# Patient Record
Sex: Male | Born: 1985
Health system: Southern US, Community
[De-identification: ages and names within clinical notes are randomized; demographics above are authoritative.]

## PROBLEM LIST (undated history)

## (undated) DIAGNOSIS — G629 Polyneuropathy, unspecified: Secondary | ICD-10-CM

## (undated) DIAGNOSIS — F1020 Alcohol dependence, uncomplicated: Secondary | ICD-10-CM

## (undated) DIAGNOSIS — R42 Dizziness and giddiness: Secondary | ICD-10-CM

## (undated) DIAGNOSIS — F411 Generalized anxiety disorder: Secondary | ICD-10-CM

## (undated) DIAGNOSIS — E109 Type 1 diabetes mellitus without complications: Secondary | ICD-10-CM

## (undated) DIAGNOSIS — K219 Gastro-esophageal reflux disease without esophagitis: Secondary | ICD-10-CM

## (undated) DIAGNOSIS — J45909 Unspecified asthma, uncomplicated: Secondary | ICD-10-CM

## (undated) HISTORY — DX: Polyneuropathy, unspecified: G62.9

## (undated) HISTORY — DX: Dizziness and giddiness: R42

## (undated) HISTORY — PX: NO PAST SURGERIES: SHX2092

## (undated) HISTORY — DX: Generalized anxiety disorder: F41.1

## (undated) HISTORY — DX: Alcohol dependence, uncomplicated: F10.20

## (undated) HISTORY — PX: OTHER SURGICAL HISTORY: SHX169

---

## 2009-08-25 DIAGNOSIS — J309 Allergic rhinitis, unspecified: Secondary | ICD-10-CM | POA: Insufficient documentation

## 2010-05-13 ENCOUNTER — Encounter: Admission: RE | Admit: 2010-05-13 | Discharge: 2010-05-13 | Payer: Self-pay | Admitting: Otolaryngology

## 2014-01-27 DIAGNOSIS — E1065 Type 1 diabetes mellitus with hyperglycemia: Secondary | ICD-10-CM | POA: Insufficient documentation

## 2014-03-13 ENCOUNTER — Institutional Professional Consult (permissible substitution): Payer: Self-pay | Admitting: Cardiology

## 2014-03-13 ENCOUNTER — Encounter: Payer: Self-pay | Admitting: General Surgery

## 2014-03-13 DIAGNOSIS — R42 Dizziness and giddiness: Secondary | ICD-10-CM | POA: Insufficient documentation

## 2014-03-26 ENCOUNTER — Encounter: Payer: Self-pay | Admitting: Cardiology

## 2014-05-06 DIAGNOSIS — N529 Male erectile dysfunction, unspecified: Secondary | ICD-10-CM | POA: Insufficient documentation

## 2014-08-10 ENCOUNTER — Ambulatory Visit (INDEPENDENT_AMBULATORY_CARE_PROVIDER_SITE_OTHER): Payer: No Typology Code available for payment source

## 2014-08-10 VITALS — BP 144/94 | HR 84 | Resp 18

## 2014-08-10 DIAGNOSIS — E1141 Type 2 diabetes mellitus with diabetic mononeuropathy: Secondary | ICD-10-CM

## 2014-08-10 DIAGNOSIS — E1040 Type 1 diabetes mellitus with diabetic neuropathy, unspecified: Secondary | ICD-10-CM

## 2014-08-10 DIAGNOSIS — M79673 Pain in unspecified foot: Secondary | ICD-10-CM

## 2014-08-10 DIAGNOSIS — E1041 Type 1 diabetes mellitus with diabetic mononeuropathy: Secondary | ICD-10-CM

## 2014-08-10 DIAGNOSIS — B351 Tinea unguium: Secondary | ICD-10-CM

## 2014-08-10 MED ORDER — GABAPENTIN 300 MG PO CAPS: 300.0000 mg | ORAL_CAPSULE | Freq: Every day | ORAL | Status: DC

## 2014-08-10 MED ORDER — TAVABOROLE 5 % EX SOLN: CUTANEOUS | Status: DC

## 2014-08-10 NOTE — Patient Instructions (Signed)
Diabetes and Foot Care Diabetes may cause you to have problems because of poor blood supply (circulation) to your feet and legs. This may cause the skin on your feet to become thinner, break easier, and heal more slowly. Your skin may become dry, and the skin may peel and crack. You may also have nerve damage in your legs and feet causing decreased feeling in them. You may not notice minor injuries to your feet that could lead to infections or more serious problems. Taking care of your feet is one of the most important things you can do for yourself.  HOME CARE INSTRUCTIONS  Wear shoes at all times, even in the house. Do not go barefoot. Bare feet are easily injured.  Check your feet daily for blisters, cuts, and redness. If you cannot see the bottom of your feet, use a mirror or ask someone for help.  Wash your feet with warm water (do not use hot water) and mild soap. Then pat your feet and the areas between your toes until they are completely dry. Do not soak your feet as this can dry your skin.  Apply a moisturizing lotion or petroleum jelly (that does not contain alcohol and is unscented) to the skin on your feet and to dry, brittle toenails. Do not apply lotion between your toes.  Trim your toenails straight across. Do not dig under them or around the cuticle. File the edges of your nails with an emery board or nail file.  Do not cut corns or calluses or try to remove them with medicine.  Wear clean socks or stockings every day. Make sure they are not too tight. Do not wear knee-high stockings since they may decrease blood flow to your legs.  Wear shoes that fit properly and have enough cushioning. To break in new shoes, wear them for just a few hours a day. This prevents you from injuring your feet. Always look in your shoes before you put them on to be sure there are no objects inside.  Do not cross your legs. This may decrease the blood flow to your feet.  If you find a minor scrape,  cut, or break in the skin on your feet, keep it and the skin around it clean and dry. These areas may be cleansed with mild soap and water. Do not cleanse the area with peroxide, alcohol, or iodine.  When you remove an adhesive bandage, be sure not to damage the skin around it.  If you have a wound, look at it several times a day to make sure it is healing.  Do not use heating pads or hot water bottles. They may burn your skin. If you have lost feeling in your feet or legs, you may not know it is happening until it is too late.  Make sure your health care provider performs a complete foot exam at least annually or more often if you have foot problems. Report any cuts, sores, or bruises to your health care provider immediately. SEEK MEDICAL CARE IF:   You have an injury that is not healing.  You have cuts or breaks in the skin.  You have an ingrown nail.  You notice redness on your legs or feet.  You feel burning or tingling in your legs or feet.  You have pain or cramps in your legs and feet.  Your legs or feet are numb.  Your feet always feel cold. SEEK IMMEDIATE MEDICAL CARE IF:   There is increasing redness,   swelling, or pain in or around a wound.  There is a red line that goes up your leg.  Pus is coming from a wound.  You develop a fever or as directed by your health care provider.  You notice a bad smell coming from an ulcer or wound. Document Released: 06/09/2000 Document Revised: 02/12/2013 Document Reviewed: 11/19/2012 ExitCare Patient Information 2015 [employer]. This information is not intended to replace advice given to you by your health care provider. Make sure you discuss any questions you have with your health care provider.  

## 2014-08-10 NOTE — Progress Notes (Signed)
   Subjective:    Patient ID: Daneil DanCameron Guadiana, male    DOB: 08/17/1985, 29 y.o.   MRN: [id number]  HPI I AM A DIABETIC AND HAVE BEEN FOR ABOUT 25 YEARS AND DOES BURN AND THROB ON THE OUTSIDE AND THERE IS NO NUMBNESS OR TINGLING AND MY FEET ARE COLD AT TIMES AND NO SWELLING AND MY 2ND TOE ON MY RIGHT I JAMMED IT TWO MONTHS AGO AND IS THICK AND HAS A BLACK SPOT ON IT AND DR ARONSON REFERRED ME OVER    Review of Systems  All other systems reviewed and are negative.      Objective:   Physical Exam 29 year old white male well-developed well-nourished oriented 3 presents this time approximately 25 year history of diabetes was recently having burning and throbbing in the outside of the foot keeps him awake at night there is tingling also feet get cold at times there is also some swelling and some dystrophy discoloration and signs of injury to the second toes bilateral right more so than left likely consistent with onychomycosis. He jammed to 2 months ago is got some sign of dried blood in the right great right second toe left hallux shows yellowing and discoloration and friability I she left second digit not hallux. On orthopedic biomechanical exam rectus foot type ankle metatarsal subtalar joint motions normal mild flexible digital contractures noted average to high arch foot is identified. Muscle strength intact and symmetric bilateral there is normal plantar response and DTRs. On palpation there is good muscle strength tendons ligaments intact no areas of numbness there is normal plantar response DTRs not elicited dermatologically skin color pigment normal hair growth present nails somewhat criptotic and the second bilateral with discoloration friability second right history of injury or trauma is also noted. Orthopedic exam otherwise unremarkable no open wounds no ulcers no secondary infections no significant been deformities are identified.       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 Ring good shoes and some and tennis shoes and socks at most times however continues starting develop paresthesia consistent with diabetic neuritis and neuropathy. Plan at this time discussed options recommended gabapentin treadmill grams daily at bedtime as a trial understands side effects of sleepiness reappointed one to 2 months for follow-up and possible adjustments of medicines also did recommend a vitamin supplement will take a general vitamins multivitamin with B complex including vitamin B6B 12 and folic acid we'll obtain OTC vitamins at [employer] cost available. Also suggested this time prescription for Kerydin for the fungus nails Jodi GeraldsKerydin [facility] and patient will initiate daily application for 12 months as instructed. Reappointed 2 weeks for follow-up if not improved will consider higher doses of gabapentin or some point consider Lyrica or other alternative medications. Next  Alvan Dameichard Jesilyn Easom DPM

## 2014-09-10 ENCOUNTER — Ambulatory Visit: Payer: No Typology Code available for payment source

## 2014-11-06 ENCOUNTER — Telehealth: Payer: Self-pay | Admitting: *Deleted

## 2014-11-06 MED ORDER — GABAPENTIN 300 MG PO CAPS: 300.0000 mg | ORAL_CAPSULE | Freq: Every day | ORAL | Status: DC

## 2014-11-06 NOTE — Telephone Encounter (Signed)
Pt request ref[facility] of [facility] [place]  Dr. Ardelle AntonWagoner ordered refill as previously ordered.  I left a message informing pt the rx had been sent to the [facility].

## 2014-12-22 ENCOUNTER — Telehealth: Payer: Self-pay | Admitting: *Deleted

## 2014-12-22 MED ORDER — GABAPENTIN 300 MG PO CAPS: 300.0000 mg | ORAL_CAPSULE | Freq: Every day | ORAL | Status: DC

## 2014-12-22 NOTE — Telephone Encounter (Signed)
Pt states he was told by pharmacist it was too early for a refill of his Gabapentin.  Pt states Dr. Ralene CorkSikora said he could take two at bedtime if necessary.  Dr. Charlsie Merlesegal states refill Gabapentin 300mg  #60 1 or 2 tablets at hs plus 5 refills.  Left message that Gabapentin was refilled.

## 2015-04-13 ENCOUNTER — Telehealth: Payer: Self-pay | Admitting: *Deleted

## 2015-04-13 NOTE — Telephone Encounter (Signed)
Pt states most nights he takes 2 Gabapentin 300mg  a few hours before bedtime, but if continues to have pain may take another one.  Pt states he is currently out of the Gabapentin 300mg  a few days early and the pharmacy will not fill.  Dr. Charlsie Merlesegal states okay to refill early and he will review the dosing.  Called [facility] [phone] left message informing to fill early.

## 2015-05-12 ENCOUNTER — Telehealth: Payer: Self-pay | Admitting: *Deleted

## 2015-05-12 MED ORDER — GABAPENTIN 300 MG PO CAPS: ORAL_CAPSULE | ORAL | Status: DC

## 2015-05-12 NOTE — Telephone Encounter (Signed)
Pt states at last call his Gabapentin 300mg  was to be written so pt could take 1-3 at bedtime if needed for nerve pain, and was not changed.  I left message for pt and apologized that I had made a mistake and would call in Gabapentin 300mg  #120 1-3 capsules as needed at bedtime, and he would need to make an appt with Dr. Marylene LandStover in [place] prior to future refills.

## 2015-06-16 ENCOUNTER — Other Ambulatory Visit: Payer: Self-pay | Admitting: Podiatry

## 2015-07-28 ENCOUNTER — Telehealth: Payer: Self-pay | Admitting: *Deleted

## 2015-07-28 MED ORDER — GABAPENTIN 300 MG PO CAPS: ORAL_CAPSULE | ORAL | Status: DC

## 2015-07-28 NOTE — Telephone Encounter (Addendum)
Pt request refill of Gabapentin. I refilled the rx 05/2015 and had requested pt make an appt to become established with a new doctor.  Pt states he didn't understand that because the medication is working.  I told pt if this would be a long term medication for him there may need to be lab, or other testing to make certain he was staying in good health.  Pt agreed and I wrote for half of the usual refill and told him I would have a scheduler call him tomorrow.  Pt agreed.  08/17/2015-PT STATES HE HAS AN APPT FOR 08/24/2015 and would like to get refill of Gabapentin to get him to the appt date to get him established with a new doctor.  Left message that a partial refill was waiting for him at the [facility] in [place].

## 2015-08-18 MED ORDER — GABAPENTIN 300 MG PO CAPS: ORAL_CAPSULE | ORAL | Status: DC

## 2015-08-23 ENCOUNTER — Ambulatory Visit (INDEPENDENT_AMBULATORY_CARE_PROVIDER_SITE_OTHER): Payer: BLUE CROSS/BLUE SHIELD | Admitting: Podiatry

## 2015-08-23 ENCOUNTER — Encounter: Payer: Self-pay | Admitting: Podiatry

## 2015-08-23 VITALS — BP 121/79 | HR 83 | Resp 12

## 2015-08-23 DIAGNOSIS — M205X1 Other deformities of toe(s) (acquired), right foot: Secondary | ICD-10-CM | POA: Diagnosis not present

## 2015-08-23 DIAGNOSIS — E1040 Type 1 diabetes mellitus with diabetic neuropathy, unspecified: Secondary | ICD-10-CM | POA: Diagnosis not present

## 2015-08-23 MED ORDER — GABAPENTIN 100 MG PO CAPS: 100.0000 mg | ORAL_CAPSULE | Freq: Three times a day (TID) | ORAL | Status: DC

## 2015-08-23 MED ORDER — MELOXICAM 15 MG PO TABS: 15.0000 mg | ORAL_TABLET | Freq: Every day | ORAL | Status: DC

## 2015-08-23 NOTE — Addendum Note (Signed)
Addended by: Harlon Flor, Braxen Dobek L on: 08/23/2015 01:41 PM   Modules accepted: Orders

## 2015-08-23 NOTE — Addendum Note (Signed)
Addended by: Harlon Flor, Ygnacio Fecteau L on: 08/23/2015 03:43 PM   Modules accepted: Orders, Medications

## 2015-08-23 NOTE — Progress Notes (Signed)
Subjective:     Patient ID: Calvin Baxter, male   DOB: 1985-08-11, 30 y.o.   MRN: [id number]  HPI this patient presents to the office with chief complaint of tingling on the outside of both of his feet. He was initially a patient of Dr. Ralene Cork who diagnosed him as having a diabetic neuropathy Dr. Ralene Cork Road for a prescription of gabapentin [place]. He states that he has been taking the gabapentin which has helped to limit the burning in his feet. He was told to come to the office by his pharmacist to get a new prescription for the gabapentin. He states as well as points to the inside and outside of both feet as the areas of his tingling. Patient has been a diabetic since the age of [age].  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  He describes no other symptoms at this time.   Review of Systems     Objective:   Physical Exam GENERAL APPEARANCE: Alert, conversant. Appropriately groomed. No acute distress.  VASCULAR: Pedal pulses palpable at  [facility] and PT bilateral.  Capillary refill time is immediate to all digits,  Normal temperature gradient.  Digital hair growth is present bilateral  NEUROLOGIC: sensation is normal to 5.07 monofilament at 5/5 sites bilateral.  Light touch is intact bilateral, Muscle strength normal.  MUSCULOSKELETAL: acceptable muscle strength, tone and stability bilateral.  Intrinsic muscluature intact bilateral.  Rectus appearance of foot and digits noted bilateral. Dorsal spurring 1st MPJ right foot with hammer toe right foot.  Patient has tingling along the medial aspect 1st MPJ B/L and along the dorsolateral aspect 5th MPJ B/L  No palpable pain or swelling noted.  DERMATOLOGIC: skin color, texture, and turgor are within normal limits.  No preulcerative lesions or ulcers  are seen, no interdigital maceration noted.  No open lesions present.  Digital nails are asymptomatic. No drainage noted.      Assessment:     Diabetic neuropathy      Plan:     ROV  After my evaluation of his feet I was not sure he was experiencing neuropathy symptoms.  I felt his problem could stem from  1st MPJ limitation of motion and tailor bunion B/L. Therefore I prescribed one month of gabapentin as well as one month of Mobic.  Told him to determine which shoes aggravate his condition.  RTC 4-6 weeks for further re-evaluation.     Helane Gunther DPM

## 2015-09-07 ENCOUNTER — Telehealth: Payer: Self-pay | Admitting: *Deleted

## 2015-09-07 NOTE — Telephone Encounter (Signed)
Pt states he was prescribed 15 Meloxicam and would like a refill.  I told pt his records show he was prescribed #30 and at one tablet a day he should have enough to get him to about 09/20/2015, which was enough time to get an appt to discuss medications and refills as ordered by Dr. Stacie AcresMayer on 08/23/2015.

## 2015-09-08 ENCOUNTER — Telehealth: Payer: Self-pay | Admitting: *Deleted

## 2015-09-08 MED ORDER — MELOXICAM 15 MG PO TABS: 15.0000 mg | ORAL_TABLET | Freq: Every day | ORAL | Status: DC

## 2015-09-08 NOTE — Telephone Encounter (Signed)
Pt states he only received #15 Meloxicam 15mg  and was suppose to get #30.  I told pt I refilled for the remaining #15 and that he needed to make sure he kept his appt to make certain he was healing properly.

## 2015-09-16 ENCOUNTER — Telehealth: Payer: Self-pay | Admitting: *Deleted

## 2015-09-16 MED ORDER — GABAPENTIN 100 MG PO CAPS: 100.0000 mg | ORAL_CAPSULE | Freq: Three times a day (TID) | ORAL | Status: DC

## 2015-09-16 NOTE — Telephone Encounter (Addendum)
Pt states prior to switching to Dr. Stacie AcresMayer, Dr. Ralene CorkSikora had pt taking Gabapentin 300mg  one tablet at 700pm and 2 tablets at bedtime and he was able to rest.  Pt state Dr. Stacie AcresMayer put him on Meloxicam and decreased to Gabapentin 100mg  1 tablet 3 times a day and the 2 tablets at bedtime and he is having difficulty sleeping.  I told pt I would ask Dr. Stacie AcresMayer 09/17/2015 and call back.  Pt asked for refill of the Gabapentin even as Dr. Stacie AcresMayer wrote, because he is totally out.  I agreed.  09/20/2015-Left message informing pt of Dr. Aram BeechamMayer's orders to return to the Gabapentin 300mg .  Orders to Pharmacy.

## 2015-09-20 ENCOUNTER — Ambulatory Visit: Payer: BLUE CROSS/BLUE SHIELD | Admitting: Podiatry

## 2015-09-20 MED ORDER — GABAPENTIN 300 MG PO CAPS: ORAL_CAPSULE | ORAL | Status: DC

## 2015-09-27 ENCOUNTER — Ambulatory Visit: Payer: BLUE CROSS/BLUE SHIELD | Admitting: Podiatry

## 2015-09-29 DIAGNOSIS — E108 Type 1 diabetes mellitus with unspecified complications: Secondary | ICD-10-CM | POA: Diagnosis not present

## 2015-09-29 DIAGNOSIS — F411 Generalized anxiety disorder: Secondary | ICD-10-CM | POA: Diagnosis not present

## 2015-09-29 DIAGNOSIS — E1065 Type 1 diabetes mellitus with hyperglycemia: Secondary | ICD-10-CM | POA: Diagnosis not present

## 2015-11-17 DIAGNOSIS — Z3141 Encounter for fertility testing: Secondary | ICD-10-CM | POA: Diagnosis not present

## 2015-12-21 DIAGNOSIS — E108 Type 1 diabetes mellitus with unspecified complications: Secondary | ICD-10-CM | POA: Diagnosis not present

## 2015-12-21 DIAGNOSIS — Z9641 Presence of insulin pump (external) (internal): Secondary | ICD-10-CM | POA: Diagnosis not present

## 2015-12-21 DIAGNOSIS — Z794 Long term (current) use of insulin: Secondary | ICD-10-CM | POA: Diagnosis not present

## 2015-12-21 DIAGNOSIS — E109 Type 1 diabetes mellitus without complications: Secondary | ICD-10-CM | POA: Diagnosis not present

## 2016-01-06 DIAGNOSIS — E1065 Type 1 diabetes mellitus with hyperglycemia: Secondary | ICD-10-CM | POA: Diagnosis not present

## 2016-01-06 DIAGNOSIS — R0981 Nasal congestion: Secondary | ICD-10-CM | POA: Diagnosis not present

## 2016-01-06 DIAGNOSIS — M67441 Ganglion, right hand: Secondary | ICD-10-CM | POA: Diagnosis not present

## 2016-01-06 DIAGNOSIS — J302 Other seasonal allergic rhinitis: Secondary | ICD-10-CM | POA: Diagnosis not present

## 2016-02-17 DIAGNOSIS — Z9641 Presence of insulin pump (external) (internal): Secondary | ICD-10-CM | POA: Diagnosis not present

## 2016-02-17 DIAGNOSIS — Z794 Long term (current) use of insulin: Secondary | ICD-10-CM | POA: Diagnosis not present

## 2016-02-17 DIAGNOSIS — E109 Type 1 diabetes mellitus without complications: Secondary | ICD-10-CM | POA: Diagnosis not present

## 2016-02-17 DIAGNOSIS — E108 Type 1 diabetes mellitus with unspecified complications: Secondary | ICD-10-CM | POA: Diagnosis not present

## 2016-02-23 DIAGNOSIS — E1065 Type 1 diabetes mellitus with hyperglycemia: Secondary | ICD-10-CM | POA: Diagnosis not present

## 2016-02-23 DIAGNOSIS — E108 Type 1 diabetes mellitus with unspecified complications: Secondary | ICD-10-CM | POA: Diagnosis not present

## 2016-02-23 DIAGNOSIS — F5221 Male erectile disorder: Secondary | ICD-10-CM | POA: Diagnosis not present

## 2016-02-23 DIAGNOSIS — F411 Generalized anxiety disorder: Secondary | ICD-10-CM | POA: Diagnosis not present

## 2016-04-19 ENCOUNTER — Telehealth: Payer: Self-pay | Admitting: Podiatry

## 2016-04-19 ENCOUNTER — Telehealth: Payer: Self-pay | Admitting: *Deleted

## 2016-04-19 MED ORDER — GABAPENTIN 300 MG PO CAPS: ORAL_CAPSULE | ORAL | 0 refills | Status: DC

## 2016-04-19 NOTE — Telephone Encounter (Signed)
Dr. Stacie AcresMayer states he received a call from pt requesting Gabapentin, and he does not prescribe this, recommends pt see one of the other doctors in our practice. Informed pt of Dr. Aram BeechamMayer's statement and recommendation to be seen by another doctor. I told pt I would refill for 30 days and have a scheduler call him tomorrow to help him schedule with Dr. Marylene LandStover in [place]

## 2016-04-19 NOTE — Telephone Encounter (Signed)
Patient request a refill on Gabapentin 300 MG. [facility] [address] in [place].

## 2016-04-26 ENCOUNTER — Encounter: Payer: Self-pay | Admitting: Sports Medicine

## 2016-04-26 ENCOUNTER — Ambulatory Visit (INDEPENDENT_AMBULATORY_CARE_PROVIDER_SITE_OTHER): Payer: BLUE CROSS/BLUE SHIELD | Admitting: Sports Medicine

## 2016-04-26 DIAGNOSIS — M79671 Pain in right foot: Secondary | ICD-10-CM | POA: Diagnosis not present

## 2016-04-26 DIAGNOSIS — M79672 Pain in left foot: Secondary | ICD-10-CM | POA: Diagnosis not present

## 2016-04-26 DIAGNOSIS — E1040 Type 1 diabetes mellitus with diabetic neuropathy, unspecified: Secondary | ICD-10-CM | POA: Diagnosis not present

## 2016-04-26 MED ORDER — GABAPENTIN 300 MG PO CAPS: ORAL_CAPSULE | ORAL | 5 refills | Status: DC

## 2016-04-26 NOTE — Progress Notes (Signed)
Subjective: Calvin Baxter is a 30 y.o. male patient with history of diabetes who presents to office today complaining of tingling to both feet best controlled with Gabapentin; desires med refill. Was started on medication by Dr. Ralene CorkSikora with improvement. 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 Denies any other complaints.  A1c 7.9  Patient Active Problem List   Diagnosis Date Noted  . Dizziness 03/13/2014   Current Outpatient Prescriptions on File Prior to Visit  Medication Sig Dispense Refill  . escitalopram (LEXAPRO) 10 MG tablet Take 10 mg by mouth daily.    . fluticasone (FLONASE) 50 MCG/ACT nasal spray Place 2 sprays into both nostrils daily.    Marland Kitchen. glucagon (GLUCAGON EMERGENCY) 1 MG injection Inject 1 mg into the vein once as needed (in emergency situation).    . Insulin Disposable Pump MISC by Does not apply route.    . insulin lispro (HUMALOG) 100 UNIT/ML injection Inject 100 Units into the skin as needed for high blood sugar (3 vials monthly per pump A1C not at goal <7%).    . LORazepam (ATIVAN) 1 MG tablet Take 1 mg by mouth every 8 (eight) hours.    . meloxicam (MOBIC) 15 MG tablet Take 1 tablet (15 mg total) by mouth daily. 15 tablet 0  . Tavaborole (KERYDIN) 5 % SOLN Apply to affected nails once daily for 12 months 10 mL 2   No current facility-administered medications on file prior to visit.    No Known Allergies  No results found for this or any previous visit (from the past 2160 hour(s)).  Objective: General: Patient is awake, alert, and oriented x 3 and in no acute distress.  Integument: Skin is warm, dry and supple bilateral. Nails are short and thick but well manicured. No signs of infection. No open lesions or preulcerative lesions present bilateral. Remaining integument unremarkable.  Vasculature:  Dorsalis Pedis pulse 2/4 bilateral. Posterior Tibial pulse  2/4 bilateral.  Capillary fill time <3 sec 1-5 bilateral. Positive hair growth to the level of the digits.Temperature gradient within normal limits. No varicosities present bilateral. No edema present bilateral.   Neurology: The patient has intact sensation measured with a 5.07/10g Semmes Weinstein Monofilament at all pedal sites bilateral with some areas of hypersensitivity. Vibratory sensation intact bilateral with tuning fork. No Babinski sign present bilateral.   Musculoskeletal: Asymptomatic prominent styloid process and hallux limitus pedal deformities noted bilateral. Muscular strength 5/5 in all lower extremity muscular groups bilateral without pain on range of motion. No tenderness with calf compression bilateral.  Assessment and Plan: Problem List Items Addressed This Visit    None    Visit Diagnoses    Type 1 diabetes, controlled, with neuropathy (HCC)    -  Primary   Relevant Medications   Insulin Lispro (HUMALOG [place])   gabapentin (NEURONTIN) 300 MG capsule   Foot pain, bilateral          -Examined patient. -Discussed and educated patient on diabetic foot care, especially with regards to the vascular, neurological and musculoskeletal systems.  -Stressed the importance of good glycemic control and the detriment of not controlling glucose levels in relation to the foot. -Refilled Gabapentin -Recommend continue with good supportive shoes and to consider inserts  -Answered all patient questions -Patient to return  in 3-4 months for follow up diabetic foot exam and med check -Patient advised to call the office if  any problems or questions arise in the meantime.  Landis Martins, DPM

## 2016-05-10 DIAGNOSIS — Z794 Long term (current) use of insulin: Secondary | ICD-10-CM | POA: Diagnosis not present

## 2016-05-10 DIAGNOSIS — E108 Type 1 diabetes mellitus with unspecified complications: Secondary | ICD-10-CM | POA: Diagnosis not present

## 2016-05-10 DIAGNOSIS — Z9641 Presence of insulin pump (external) (internal): Secondary | ICD-10-CM | POA: Diagnosis not present

## 2016-05-10 DIAGNOSIS — E109 Type 1 diabetes mellitus without complications: Secondary | ICD-10-CM | POA: Diagnosis not present

## 2016-06-14 DIAGNOSIS — Z6823 Body mass index (BMI) 23.0-23.9, adult: Secondary | ICD-10-CM | POA: Diagnosis not present

## 2016-06-14 DIAGNOSIS — J3089 Other allergic rhinitis: Secondary | ICD-10-CM | POA: Diagnosis not present

## 2016-07-26 DIAGNOSIS — J029 Acute pharyngitis, unspecified: Secondary | ICD-10-CM | POA: Diagnosis not present

## 2016-07-26 DIAGNOSIS — Z1389 Encounter for screening for other disorder: Secondary | ICD-10-CM | POA: Diagnosis not present

## 2016-07-26 DIAGNOSIS — F411 Generalized anxiety disorder: Secondary | ICD-10-CM | POA: Diagnosis not present

## 2016-07-26 DIAGNOSIS — F5221 Male erectile disorder: Secondary | ICD-10-CM | POA: Diagnosis not present

## 2016-07-26 DIAGNOSIS — E1065 Type 1 diabetes mellitus with hyperglycemia: Secondary | ICD-10-CM | POA: Diagnosis not present

## 2016-07-26 DIAGNOSIS — E108 Type 1 diabetes mellitus with unspecified complications: Secondary | ICD-10-CM | POA: Diagnosis not present

## 2016-08-23 ENCOUNTER — Ambulatory Visit: Payer: BLUE CROSS/BLUE SHIELD | Admitting: Sports Medicine

## 2016-09-29 DIAGNOSIS — Z3189 Encounter for other procreative management: Secondary | ICD-10-CM | POA: Diagnosis not present

## 2016-10-02 DIAGNOSIS — Z6824 Body mass index (BMI) 24.0-24.9, adult: Secondary | ICD-10-CM | POA: Diagnosis not present

## 2016-10-02 DIAGNOSIS — M79671 Pain in right foot: Secondary | ICD-10-CM | POA: Diagnosis not present

## 2016-11-14 DIAGNOSIS — Z0000 Encounter for general adult medical examination without abnormal findings: Secondary | ICD-10-CM | POA: Insufficient documentation

## 2016-11-14 DIAGNOSIS — E1065 Type 1 diabetes mellitus with hyperglycemia: Secondary | ICD-10-CM | POA: Diagnosis not present

## 2016-11-22 ENCOUNTER — Other Ambulatory Visit: Payer: Self-pay | Admitting: Sports Medicine

## 2016-11-22 ENCOUNTER — Telehealth: Payer: Self-pay | Admitting: Sports Medicine

## 2016-11-22 DIAGNOSIS — E1040 Type 1 diabetes mellitus with diabetic neuropathy, unspecified: Secondary | ICD-10-CM

## 2016-11-22 MED ORDER — GABAPENTIN 300 MG PO CAPS: ORAL_CAPSULE | ORAL | 5 refills | Status: DC

## 2016-11-22 NOTE — Telephone Encounter (Signed)
Pt needs a refill on Gabepentin.Marland Kitchen.Marland Kitchen.Pharmacy [facility] [place]

## 2016-11-22 NOTE — Progress Notes (Signed)
Refilled Gabapentin -Dr. Kyon Bentler 

## 2016-11-23 DIAGNOSIS — Z0000 Encounter for general adult medical examination without abnormal findings: Secondary | ICD-10-CM | POA: Diagnosis not present

## 2016-11-23 DIAGNOSIS — F411 Generalized anxiety disorder: Secondary | ICD-10-CM | POA: Diagnosis not present

## 2016-11-23 DIAGNOSIS — E1065 Type 1 diabetes mellitus with hyperglycemia: Secondary | ICD-10-CM | POA: Diagnosis not present

## 2016-11-23 DIAGNOSIS — J309 Allergic rhinitis, unspecified: Secondary | ICD-10-CM | POA: Diagnosis not present

## 2016-11-23 DIAGNOSIS — F5221 Male erectile disorder: Secondary | ICD-10-CM | POA: Diagnosis not present

## 2016-12-10 DIAGNOSIS — J0100 Acute maxillary sinusitis, unspecified: Secondary | ICD-10-CM | POA: Diagnosis not present

## 2016-12-15 DIAGNOSIS — E108 Type 1 diabetes mellitus with unspecified complications: Secondary | ICD-10-CM | POA: Diagnosis not present

## 2016-12-15 DIAGNOSIS — Z794 Long term (current) use of insulin: Secondary | ICD-10-CM | POA: Diagnosis not present

## 2016-12-15 DIAGNOSIS — E1065 Type 1 diabetes mellitus with hyperglycemia: Secondary | ICD-10-CM | POA: Diagnosis not present

## 2016-12-15 DIAGNOSIS — K521 Toxic gastroenteritis and colitis: Secondary | ICD-10-CM | POA: Diagnosis not present

## 2016-12-15 DIAGNOSIS — Z9641 Presence of insulin pump (external) (internal): Secondary | ICD-10-CM | POA: Diagnosis not present

## 2016-12-15 DIAGNOSIS — Z6824 Body mass index (BMI) 24.0-24.9, adult: Secondary | ICD-10-CM | POA: Diagnosis not present

## 2016-12-15 DIAGNOSIS — E109 Type 1 diabetes mellitus without complications: Secondary | ICD-10-CM | POA: Diagnosis not present

## 2017-01-02 DIAGNOSIS — F419 Anxiety disorder, unspecified: Secondary | ICD-10-CM | POA: Diagnosis not present

## 2017-01-02 DIAGNOSIS — R1110 Vomiting, unspecified: Secondary | ICD-10-CM | POA: Diagnosis not present

## 2017-01-02 DIAGNOSIS — K529 Noninfective gastroenteritis and colitis, unspecified: Secondary | ICD-10-CM | POA: Diagnosis not present

## 2017-01-02 DIAGNOSIS — Z794 Long term (current) use of insulin: Secondary | ICD-10-CM | POA: Diagnosis not present

## 2017-01-02 DIAGNOSIS — R112 Nausea with vomiting, unspecified: Secondary | ICD-10-CM | POA: Diagnosis not present

## 2017-01-02 DIAGNOSIS — E119 Type 2 diabetes mellitus without complications: Secondary | ICD-10-CM | POA: Diagnosis not present

## 2017-02-07 DIAGNOSIS — F410 Panic disorder [episodic paroxysmal anxiety] without agoraphobia: Secondary | ICD-10-CM | POA: Diagnosis not present

## 2017-02-07 DIAGNOSIS — F411 Generalized anxiety disorder: Secondary | ICD-10-CM | POA: Diagnosis not present

## 2017-02-14 DIAGNOSIS — J069 Acute upper respiratory infection, unspecified: Secondary | ICD-10-CM | POA: Diagnosis not present

## 2017-02-28 DIAGNOSIS — E109 Type 1 diabetes mellitus without complications: Secondary | ICD-10-CM | POA: Diagnosis not present

## 2017-02-28 DIAGNOSIS — Z794 Long term (current) use of insulin: Secondary | ICD-10-CM | POA: Diagnosis not present

## 2017-02-28 DIAGNOSIS — E108 Type 1 diabetes mellitus with unspecified complications: Secondary | ICD-10-CM | POA: Diagnosis not present

## 2017-02-28 DIAGNOSIS — Z9641 Presence of insulin pump (external) (internal): Secondary | ICD-10-CM | POA: Diagnosis not present

## 2017-03-01 DIAGNOSIS — F410 Panic disorder [episodic paroxysmal anxiety] without agoraphobia: Secondary | ICD-10-CM | POA: Diagnosis not present

## 2017-03-01 DIAGNOSIS — F411 Generalized anxiety disorder: Secondary | ICD-10-CM | POA: Diagnosis not present

## 2017-04-24 DIAGNOSIS — F410 Panic disorder [episodic paroxysmal anxiety] without agoraphobia: Secondary | ICD-10-CM | POA: Diagnosis not present

## 2017-04-24 DIAGNOSIS — F411 Generalized anxiety disorder: Secondary | ICD-10-CM | POA: Diagnosis not present

## 2017-05-03 ENCOUNTER — Encounter: Payer: Self-pay | Admitting: Gastroenterology

## 2017-05-05 DIAGNOSIS — E1165 Type 2 diabetes mellitus with hyperglycemia: Secondary | ICD-10-CM | POA: Diagnosis not present

## 2017-05-05 DIAGNOSIS — F419 Anxiety disorder, unspecified: Secondary | ICD-10-CM | POA: Diagnosis not present

## 2017-05-05 DIAGNOSIS — E119 Type 2 diabetes mellitus without complications: Secondary | ICD-10-CM | POA: Diagnosis not present

## 2017-05-28 ENCOUNTER — Ambulatory Visit: Payer: BLUE CROSS/BLUE SHIELD | Admitting: Podiatry

## 2017-05-28 ENCOUNTER — Encounter: Payer: Self-pay | Admitting: Podiatry

## 2017-05-28 VITALS — BP 123/83 | HR 74

## 2017-05-28 DIAGNOSIS — E1142 Type 2 diabetes mellitus with diabetic polyneuropathy: Secondary | ICD-10-CM | POA: Diagnosis not present

## 2017-05-28 DIAGNOSIS — M792 Neuralgia and neuritis, unspecified: Secondary | ICD-10-CM | POA: Diagnosis not present

## 2017-05-28 MED ORDER — NONFORMULARY OR COMPOUNDED ITEM: 1.0000 g | Freq: Four times a day (QID) | 2 refills | Status: DC

## 2017-05-28 NOTE — Progress Notes (Signed)
  Subjective:  Patient ID: Calvin Baxter, male    DOB: 08/11/1985,  MRN: [id number]  Chief Complaint  Patient presents with  . Foot Pain   31 y.o. male returns for the above complaint.  States that he was taking gabapentin long-term for neuropathic pain.  States that he is seeing a new psychiatrist she is giving him gabapentin for anxiety but has lowered the dose and now he is having increasing pain in his foot.  Objective:   Vitals:   05/28/17 1005  BP: 123/83  Pulse: 74   General AA&O x3. Normal mood and affect.  Vascular Pedal pulses palpable.  Neurologic Epicritic sensation grossly intact.  Dermatologic No open lesions. Skin normal texture and turgor.  Orthopedic: No pain to palpation either foot.   Assessment & Plan:  Patient was evaluated and treated and all questions answered.  Neuropathic pain -Discussed that multiple providers should not be adjusting oral dose of gabapentin.  Defer to his psychiatrist for management. -Rx compound pain cream with gabapentin.  Advised that this would help with his pain and also with low systemic absorption should not significantly alter the dose that he is receiving systemically  Follow-up as needed

## 2017-06-01 DIAGNOSIS — E108 Type 1 diabetes mellitus with unspecified complications: Secondary | ICD-10-CM | POA: Diagnosis not present

## 2017-06-01 DIAGNOSIS — Z794 Long term (current) use of insulin: Secondary | ICD-10-CM | POA: Diagnosis not present

## 2017-06-01 DIAGNOSIS — Z9641 Presence of insulin pump (external) (internal): Secondary | ICD-10-CM | POA: Diagnosis not present

## 2017-06-01 DIAGNOSIS — E109 Type 1 diabetes mellitus without complications: Secondary | ICD-10-CM | POA: Diagnosis not present

## 2017-06-08 DIAGNOSIS — F410 Panic disorder [episodic paroxysmal anxiety] without agoraphobia: Secondary | ICD-10-CM | POA: Diagnosis not present

## 2017-06-08 DIAGNOSIS — F411 Generalized anxiety disorder: Secondary | ICD-10-CM | POA: Diagnosis not present

## 2017-06-20 NOTE — Progress Notes (Signed)
Referral paperwork loaded - updated med list

## 2017-06-28 ENCOUNTER — Encounter (INDEPENDENT_AMBULATORY_CARE_PROVIDER_SITE_OTHER): Payer: Self-pay

## 2017-06-28 ENCOUNTER — Ambulatory Visit: Payer: BLUE CROSS/BLUE SHIELD | Admitting: Gastroenterology

## 2017-06-28 ENCOUNTER — Encounter: Payer: Self-pay | Admitting: Gastroenterology

## 2017-06-28 ENCOUNTER — Other Ambulatory Visit (INDEPENDENT_AMBULATORY_CARE_PROVIDER_SITE_OTHER): Payer: BLUE CROSS/BLUE SHIELD

## 2017-06-28 VITALS — BP 114/80 | HR 80 | Ht 67.0 in | Wt 172.5 lb

## 2017-06-28 DIAGNOSIS — E108 Type 1 diabetes mellitus with unspecified complications: Secondary | ICD-10-CM | POA: Diagnosis not present

## 2017-06-28 DIAGNOSIS — R110 Nausea: Secondary | ICD-10-CM | POA: Diagnosis not present

## 2017-06-28 DIAGNOSIS — R194 Change in bowel habit: Secondary | ICD-10-CM

## 2017-06-28 DIAGNOSIS — K219 Gastro-esophageal reflux disease without esophagitis: Secondary | ICD-10-CM | POA: Diagnosis not present

## 2017-06-28 DIAGNOSIS — F1021 Alcohol dependence, in remission: Secondary | ICD-10-CM | POA: Diagnosis not present

## 2017-06-28 DIAGNOSIS — F411 Generalized anxiety disorder: Secondary | ICD-10-CM | POA: Diagnosis not present

## 2017-06-28 DIAGNOSIS — F1020 Alcohol dependence, uncomplicated: Secondary | ICD-10-CM | POA: Diagnosis not present

## 2017-06-28 DIAGNOSIS — K110 Atrophy of salivary gland: Secondary | ICD-10-CM | POA: Diagnosis not present

## 2017-06-28 DIAGNOSIS — IMO0002 Reserved for concepts with insufficient information to code with codable children: Secondary | ICD-10-CM | POA: Insufficient documentation

## 2017-06-28 DIAGNOSIS — E1065 Type 1 diabetes mellitus with hyperglycemia: Secondary | ICD-10-CM | POA: Diagnosis not present

## 2017-06-28 LAB — IGA: IGA: 370 mg/dL (ref 68–378)

## 2017-06-28 MED ORDER — RANITIDINE HCL 150 MG PO TABS: 150.0000 mg | ORAL_TABLET | Freq: Every day | ORAL | 3 refills | Status: DC

## 2017-06-28 MED ORDER — ONDANSETRON 8 MG PO TBDP: 8.0000 mg | ORAL_TABLET | Freq: Three times a day (TID) | ORAL | 3 refills | Status: DC | PRN

## 2017-06-28 NOTE — Patient Instructions (Addendum)
If you are age 33 or older, your body mass index should be between 23-30. Your Body mass index is 27.02 kg/m. If this is out of the aforementioned range listed, please consider follow up with your Primary Care Provider.  If you are age 33 or younger, your body mass index should be between 19-25. Your Body mass index is 27.02 kg/m. If this is out of the aformentioned range listed, please consider follow up with your Primary Care Provider.   Please go to the lab in the basement of our building to have lab work done as you leave today.  We have sent the following medications to your pharmacy for you to pick up at your convenience: Zantac, 150mg  Zofran, 8mg  ODT  We have given you a low FOD-Map diet to follow.  Please purchase the following medications over the counter and take as directed: Imodium, take as needed  We are requesting your labs from Dr. Lanell MatarAronson's office for the past year.  Thank you for entrusting me with your care and for [facility], Dr. Ileene PatrickSteven Armbruster

## 2017-06-28 NOTE — Progress Notes (Signed)
HPI :  32 y/o male with a history of type I DM, neuropathy, anxiety, referred here by Dr. Geoffry Paradise for a new patient visit for intermittent nausea / diarrhea  He reports episodic nausea which is bothered him for a long time. He states this is usually associated when he feels anxious, and thinks it is very much related to his anxiety. He has been hospitalized twice in the past year for panic attacks, and states sometimes nausea can trigger these. He denies any vomiting with these episodes. He denies any postprandial nausea, the nausea seems to occur sporadically without clear triggers. He denies any abdominal pain. He does endorse some acid reflux symptoms of heartburn for which she takes over-the-counter Tums which does help him. He states when he uses Viagra makes his reflux significantly worse. He denies any dysphagia. He denies any weight loss.   He does endorse some early satiety, which is mild, ongoing for the past 2 years. He denies any epigastric pain. He does endorse some increased gas and bloating. He has sporadic loose stools. Some days has regular bowel movements. Other days has looser stools, again ulcer usually related to when he is anxious. He has taken Pepto-Bismol or Imodium which works for a well for him. He denies any blood in his stools. He's had a very rare episode of nocturnal symptoms. That is not common.   He has a history of type 1 diabetes since age 31, his hemoglobin A1c has been in the 9 range. He endorses ongoing issues with severe anxiety and panic attacks. He endorses a history of alcoholism, previously drank anywhere from 2-5 beers per night to help him sleep. He stopped drinking about a month ago and states this has not improved his nausea or bowels at all.   He denies any family history of colon cancer, no family history of celiac disease or Crohn's.   Past Medical History:  Diagnosis Date  . Alcoholism (HCC)   . Anxiety, generalized    on lexapro  .  Diabetes mellitus without complication (HCC)    type 1  . Dizziness   . Neuropathy    diagnosed by Dr Ralene Cork per patient     Past Surgical History:  Procedure Laterality Date  . None     Family History  Problem Relation Age of Onset  . Breast cancer Maternal Grandmother   . Colon cancer Neg Hx   . Liver cancer Neg Hx   . Rectal cancer Neg Hx   . Stomach cancer Neg Hx   . Esophageal cancer Neg Hx    Social History   Tobacco Use  . Smoking status: Former Games developer  . Smokeless tobacco: Never Used  . Tobacco comment: quit in 2008  Substance Use Topics  . Alcohol use: No    Frequency: Never    Comment: history of alcoholism  . Drug use: No   Current Outpatient Medications  Medication Sig Dispense Refill  . ALPRAZolam (XANAX) 0.25 MG tablet   0  . busPIRone (BUSPAR) 30 MG tablet 15 mg 2 (two) times daily.   0  . clonazePAM (KLONOPIN) 0.5 MG tablet 2 (two) times daily.   0  . fluvoxaMINE (LUVOX) 25 MG tablet at bedtime.   0  . gabapentin (NEURONTIN) 400 MG capsule as directed. 400mg  q am. 400mg  q noon and 800mg  qhs  0  . glucagon (GLUCAGON EMERGENCY) 1 MG injection Inject 1 mg into the vein once as needed (in emergency situation).    Marland Kitchen  insulin aspart protamine- aspart (NOVOLOG MIX 70/30) (70-30) 100 UNIT/ML injection Inject into the skin. Use as directed in insulin pump, max 100 units/day    . Insulin Disposable Pump MISC by Does not apply route.    . insulin lispro (HUMALOG) 100 UNIT/ML injection Inject 100 Units into the skin as needed for high blood sugar (3 vials monthly per pump A1C not at goal <7%).    . LORazepam (ATIVAN) 1 MG tablet Take 1 mg by mouth every 8 (eight) hours.    . metoprolol succinate (TOPROL-XL) 25 MG 24 hr tablet Take 25 mg by mouth daily.    . NONFORMULARY OR COMPOUNDED ITEM Apply 1-2 g topically 4 (four) times daily. Peripheral Neuropathy cream: Bupivacaine 1% Doxepin 3% Gabapentin 6% Ibuprofen 3% Pentoxifylline 3% 120 each 2  . PRESCRIPTION MEDICATION  C - peripheral neuropathy cream - qhs    . sildenafil (REVATIO) 20 MG tablet take 5 tablets by mouth 1 hour prior to sexual activity as needed  3  . valACYclovir (VALTREX) 1000 MG tablet Take 1,000 mg by mouth 3 (three) times daily.     No current facility-administered medications for this visit.    No Known Allergies   Review of Systems: All systems reviewed and negative except where noted in HPI.   No results found for: WBC, HGB, HCT, MCV, PLT  No results found for: CREATININE, BUN, NA, K, CL, CO2  No labs on file.   Physical Exam: BP 114/80   Pulse 80   Ht 5\' 7"  (1.702 m)   Wt 172 lb 8 oz (78.2 kg)   BMI 27.02 kg/m  Constitutional: Pleasant,well-developed, male in no acute distress. HEENT: Normocephalic and atraumatic. Conjunctivae are normal. No scleral icterus. Neck supple.  Cardiovascular: Normal rate, regular rhythm.  Pulmonary/chest: Effort normal and breath sounds normal. No wheezing, rales or rhonchi. Abdominal: Soft, nondistended, nontender.. There are no masses palpable. No hepatomegaly. Extremities: no edema Lymphadenopathy: No cervical adenopathy noted. Neurological: Alert and oriented to person place and time. Skin: Skin is warm and dry. No rashes noted. Psychiatric: Normal mood and affect. Behavior is normal.   ASSESSMENT AND PLAN: 32 year old male with a history of alcoholism, anxiety, type 1 diabetes, presenting with intermittent nausea, intermittent loose stools, and reflux symptoms.  Intermittent nausea - this very well may be related to anxiety and panic attacks, he feels there is a strong correlation. While he has some very mild early satiety at times, in light of his diabetes gastroparesis is possible, although this is quite mild and he does not vomit, thus may be less likely. He is working with his psychiatrist for management of anxiety. I offered him some Zofran to use as needed, he thinks this will be useful to help prevent panic attacks. I asked for  basic labs from his primary care haven't labs on file from today. We discussed potential endoscopy, he wanted to avoid that for now, he can call me for reassessment of his symptoms persist.  GERD - discussed options, we'll try Zantac 150 mg once to twice daily as needed.. If symptoms persist will try PPI.  Change in bowel habits - intermittent loose stools. This also appears related to anxiety, he could likely have irritable bowel syndrome. However in light of his diabetes he is at higher risk for celiac disease, pancreatic insufficiency, SIBO, and autonomic dysfunction of his bowels. Will screen with TTG IgA, total IgA level, and pancreatic fecal elastase. Discussed a low FODMAP diet with him which he will  try, handout provided, and will try Imodium as needed which is helped in the past. If symptoms persist despite these measures, may consider trial of Rifaximin and colonoscopy.   History of alcoholism - no reported cirrhosis, I have requested his baseline labs to ensure normal. Discussed importance of abstinence, he agreed.   Ileene PatrickSteven Daud Cayer, MD [facility] Pager [phone]  CC: Geoffry ParadiseAronson, Richard, MD

## 2017-06-29 LAB — TISSUE TRANSGLUTAMINASE, IGA: (TTG) AB, IGA: 1 U/mL

## 2017-07-02 ENCOUNTER — Telehealth: Payer: Self-pay | Admitting: Gastroenterology

## 2017-07-02 NOTE — Telephone Encounter (Signed)
Labwork from primary care obtained as outlined:  Labs 11/14/2016: - BUN 11, Cr 1.3 - Alb 4.0, AST 22, ALT 26, AP 123, T bil 1.0 - WBC 6.2, Hgb 18.0, MCV 97.5, plt 280 - A1c 9.3

## 2017-07-03 DIAGNOSIS — R110 Nausea: Secondary | ICD-10-CM | POA: Diagnosis not present

## 2017-07-03 DIAGNOSIS — R6883 Chills (without fever): Secondary | ICD-10-CM | POA: Diagnosis not present

## 2017-07-03 DIAGNOSIS — F411 Generalized anxiety disorder: Secondary | ICD-10-CM | POA: Diagnosis not present

## 2017-07-03 DIAGNOSIS — R05 Cough: Secondary | ICD-10-CM | POA: Diagnosis not present

## 2017-07-13 DIAGNOSIS — F411 Generalized anxiety disorder: Secondary | ICD-10-CM | POA: Diagnosis not present

## 2017-07-13 DIAGNOSIS — F410 Panic disorder [episodic paroxysmal anxiety] without agoraphobia: Secondary | ICD-10-CM | POA: Diagnosis not present

## 2017-07-23 DIAGNOSIS — J111 Influenza due to unidentified influenza virus with other respiratory manifestations: Secondary | ICD-10-CM | POA: Diagnosis not present

## 2017-07-24 DIAGNOSIS — E119 Type 2 diabetes mellitus without complications: Secondary | ICD-10-CM | POA: Diagnosis not present

## 2017-07-24 DIAGNOSIS — J111 Influenza due to unidentified influenza virus with other respiratory manifestations: Secondary | ICD-10-CM | POA: Diagnosis not present

## 2017-07-24 DIAGNOSIS — R5383 Other fatigue: Secondary | ICD-10-CM | POA: Diagnosis not present

## 2017-07-24 DIAGNOSIS — R739 Hyperglycemia, unspecified: Secondary | ICD-10-CM | POA: Diagnosis not present

## 2017-08-03 DIAGNOSIS — F410 Panic disorder [episodic paroxysmal anxiety] without agoraphobia: Secondary | ICD-10-CM | POA: Diagnosis not present

## 2017-08-03 DIAGNOSIS — F411 Generalized anxiety disorder: Secondary | ICD-10-CM | POA: Diagnosis not present

## 2017-08-13 DIAGNOSIS — Z794 Long term (current) use of insulin: Secondary | ICD-10-CM | POA: Diagnosis not present

## 2017-08-13 DIAGNOSIS — Z9641 Presence of insulin pump (external) (internal): Secondary | ICD-10-CM | POA: Diagnosis not present

## 2017-08-13 DIAGNOSIS — E109 Type 1 diabetes mellitus without complications: Secondary | ICD-10-CM | POA: Diagnosis not present

## 2017-08-13 DIAGNOSIS — E108 Type 1 diabetes mellitus with unspecified complications: Secondary | ICD-10-CM | POA: Diagnosis not present

## 2017-08-31 DIAGNOSIS — F410 Panic disorder [episodic paroxysmal anxiety] without agoraphobia: Secondary | ICD-10-CM | POA: Diagnosis not present

## 2017-08-31 DIAGNOSIS — F411 Generalized anxiety disorder: Secondary | ICD-10-CM | POA: Diagnosis not present

## 2017-10-15 DIAGNOSIS — Z794 Long term (current) use of insulin: Secondary | ICD-10-CM | POA: Diagnosis not present

## 2017-10-15 DIAGNOSIS — E109 Type 1 diabetes mellitus without complications: Secondary | ICD-10-CM | POA: Diagnosis not present

## 2017-10-15 DIAGNOSIS — E108 Type 1 diabetes mellitus with unspecified complications: Secondary | ICD-10-CM | POA: Diagnosis not present

## 2017-10-15 DIAGNOSIS — Z9641 Presence of insulin pump (external) (internal): Secondary | ICD-10-CM | POA: Diagnosis not present

## 2017-10-26 DIAGNOSIS — F411 Generalized anxiety disorder: Secondary | ICD-10-CM | POA: Diagnosis not present

## 2017-10-26 DIAGNOSIS — F410 Panic disorder [episodic paroxysmal anxiety] without agoraphobia: Secondary | ICD-10-CM | POA: Diagnosis not present

## 2017-10-31 DIAGNOSIS — M79671 Pain in right foot: Secondary | ICD-10-CM | POA: Diagnosis not present

## 2017-10-31 DIAGNOSIS — E1040 Type 1 diabetes mellitus with diabetic neuropathy, unspecified: Secondary | ICD-10-CM | POA: Diagnosis not present

## 2017-10-31 DIAGNOSIS — M79672 Pain in left foot: Secondary | ICD-10-CM | POA: Diagnosis not present

## 2017-11-09 ENCOUNTER — Encounter: Payer: Self-pay | Admitting: Neurology

## 2017-11-09 DIAGNOSIS — M79672 Pain in left foot: Secondary | ICD-10-CM | POA: Diagnosis not present

## 2017-11-09 DIAGNOSIS — M79671 Pain in right foot: Secondary | ICD-10-CM | POA: Diagnosis not present

## 2017-12-05 ENCOUNTER — Ambulatory Visit: Payer: BLUE CROSS/BLUE SHIELD | Admitting: Neurology

## 2017-12-19 DIAGNOSIS — F411 Generalized anxiety disorder: Secondary | ICD-10-CM | POA: Diagnosis not present

## 2017-12-19 DIAGNOSIS — E7849 Other hyperlipidemia: Secondary | ICD-10-CM | POA: Diagnosis not present

## 2017-12-19 DIAGNOSIS — J309 Allergic rhinitis, unspecified: Secondary | ICD-10-CM | POA: Diagnosis not present

## 2017-12-19 DIAGNOSIS — Z1389 Encounter for screening for other disorder: Secondary | ICD-10-CM | POA: Diagnosis not present

## 2017-12-19 DIAGNOSIS — E1040 Type 1 diabetes mellitus with diabetic neuropathy, unspecified: Secondary | ICD-10-CM | POA: Diagnosis not present

## 2017-12-19 DIAGNOSIS — Z0000 Encounter for general adult medical examination without abnormal findings: Secondary | ICD-10-CM | POA: Diagnosis not present

## 2017-12-19 DIAGNOSIS — E785 Hyperlipidemia, unspecified: Secondary | ICD-10-CM | POA: Insufficient documentation

## 2017-12-19 DIAGNOSIS — E108 Type 1 diabetes mellitus with unspecified complications: Secondary | ICD-10-CM | POA: Diagnosis not present

## 2017-12-19 DIAGNOSIS — F5221 Male erectile disorder: Secondary | ICD-10-CM | POA: Diagnosis not present

## 2017-12-19 DIAGNOSIS — R82998 Other abnormal findings in urine: Secondary | ICD-10-CM | POA: Diagnosis not present

## 2017-12-26 ENCOUNTER — Observation Stay (HOSPITAL_COMMUNITY): Payer: BLUE CROSS/BLUE SHIELD | Admitting: Anesthesiology

## 2017-12-26 ENCOUNTER — Encounter (HOSPITAL_COMMUNITY)
Admission: EM | Disposition: A | Discharge status: Home / Self Care | Payer: Self-pay | Source: Home / Self Care | Attending: Emergency Medicine

## 2017-12-26 ENCOUNTER — Other Ambulatory Visit: Payer: Self-pay | Admitting: Internal Medicine

## 2017-12-26 ENCOUNTER — Observation Stay (HOSPITAL_COMMUNITY)
Admission: EM | Admit: 2017-12-26 | Discharge: 2017-12-27 | Disposition: A | Discharge status: Home / Self Care | Payer: BLUE CROSS/BLUE SHIELD | Attending: Surgery | Admitting: Surgery

## 2017-12-26 ENCOUNTER — Encounter (HOSPITAL_COMMUNITY): Payer: Self-pay

## 2017-12-26 ENCOUNTER — Ambulatory Visit
Admission: RE | Admit: 2017-12-26 | Discharge: 2017-12-26 | Disposition: A | Discharge status: Home / Self Care | Payer: BLUE CROSS/BLUE SHIELD | Source: Ambulatory Visit | Attending: Internal Medicine | Admitting: Internal Medicine

## 2017-12-26 ENCOUNTER — Other Ambulatory Visit: Payer: Self-pay

## 2017-12-26 DIAGNOSIS — R1031 Right lower quadrant pain: Secondary | ICD-10-CM

## 2017-12-26 DIAGNOSIS — F411 Generalized anxiety disorder: Secondary | ICD-10-CM | POA: Diagnosis not present

## 2017-12-26 DIAGNOSIS — K3533 Acute appendicitis with perforation and localized peritonitis, with abscess: Secondary | ICD-10-CM | POA: Diagnosis not present

## 2017-12-26 DIAGNOSIS — Z87891 Personal history of nicotine dependence: Secondary | ICD-10-CM | POA: Insufficient documentation

## 2017-12-26 DIAGNOSIS — Z79899 Other long term (current) drug therapy: Secondary | ICD-10-CM | POA: Diagnosis not present

## 2017-12-26 DIAGNOSIS — K3580 Unspecified acute appendicitis: Secondary | ICD-10-CM

## 2017-12-26 DIAGNOSIS — G8929 Other chronic pain: Secondary | ICD-10-CM

## 2017-12-26 DIAGNOSIS — D72829 Elevated white blood cell count, unspecified: Secondary | ICD-10-CM | POA: Diagnosis not present

## 2017-12-26 DIAGNOSIS — E1040 Type 1 diabetes mellitus with diabetic neuropathy, unspecified: Secondary | ICD-10-CM | POA: Diagnosis not present

## 2017-12-26 DIAGNOSIS — E108 Type 1 diabetes mellitus with unspecified complications: Secondary | ICD-10-CM | POA: Diagnosis not present

## 2017-12-26 DIAGNOSIS — Z794 Long term (current) use of insulin: Secondary | ICD-10-CM | POA: Diagnosis not present

## 2017-12-26 DIAGNOSIS — R109 Unspecified abdominal pain: Secondary | ICD-10-CM | POA: Diagnosis not present

## 2017-12-26 DIAGNOSIS — Z9049 Acquired absence of other specified parts of digestive tract: Secondary | ICD-10-CM

## 2017-12-26 DIAGNOSIS — R1084 Generalized abdominal pain: Secondary | ICD-10-CM | POA: Diagnosis not present

## 2017-12-26 DIAGNOSIS — Z6825 Body mass index (BMI) 25.0-25.9, adult: Secondary | ICD-10-CM | POA: Diagnosis not present

## 2017-12-26 HISTORY — DX: Type 1 diabetes mellitus without complications: E10.9

## 2017-12-26 HISTORY — DX: Gastro-esophageal reflux disease without esophagitis: K21.9

## 2017-12-26 HISTORY — DX: Unspecified asthma, uncomplicated: J45.909

## 2017-12-26 HISTORY — PX: LAPAROSCOPIC APPENDECTOMY: SHX408

## 2017-12-26 LAB — URINALYSIS, ROUTINE W REFLEX MICROSCOPIC
BACTERIA UA: NONE SEEN
BILIRUBIN URINE: NEGATIVE
Hgb urine dipstick: NEGATIVE
KETONES UR: NEGATIVE mg/dL
LEUKOCYTES UA: NEGATIVE
NITRITE: NEGATIVE
PH: 6 (ref 5.0–8.0)
PROTEIN: NEGATIVE mg/dL
Specific Gravity, Urine: >1.046 — ABNORMAL HIGH (ref 1.005–1.030)

## 2017-12-26 LAB — GLUCOSE, CAPILLARY
GLUCOSE-CAPILLARY: 155 mg/dL — AB (ref 70–99)
GLUCOSE-CAPILLARY: 245 mg/dL — AB (ref 70–99)
Glucose-Capillary: 215 mg/dL — ABNORMAL HIGH (ref 70–99)

## 2017-12-26 LAB — CBG MONITORING, ED: Glucose-Capillary: 283 mg/dL — ABNORMAL HIGH (ref 70–99)

## 2017-12-26 LAB — COMPREHENSIVE METABOLIC PANEL
ALBUMIN: 4 g/dL (ref 3.5–5.0)
ALK PHOS: 81 U/L (ref 38–126)
ALT: 28 U/L (ref 0–44)
ANION GAP: 7 (ref 5–15)
AST: 24 U/L (ref 15–41)
BILIRUBIN TOTAL: 1.3 mg/dL — AB (ref 0.3–1.2)
BUN: 20 mg/dL (ref 6–20)
CALCIUM: 9.4 mg/dL (ref 8.9–10.3)
CO2: 28 mmol/L (ref 22–32)
Chloride: 102 mmol/L (ref 98–111)
Creatinine, Ser: 1.29 mg/dL — ABNORMAL HIGH (ref 0.61–1.24)
GFR calc Af Amer: >60 mL/min (ref >60)
GLUCOSE: 291 mg/dL — AB (ref 70–99)
POTASSIUM: 4.5 mmol/L (ref 3.5–5.1)
Sodium: 137 mmol/L (ref 135–145)
TOTAL PROTEIN: 7 g/dL (ref 6.5–8.1)

## 2017-12-26 LAB — CBC
HEMATOCRIT: 41.3 % (ref 39.0–52.0)
Hemoglobin: 14.6 g/dL (ref 13.0–17.0)
MCH: 32.4 pg (ref 26.0–34.0)
MCHC: 35.4 g/dL (ref 30.0–36.0)
MCV: 91.6 fL (ref 78.0–100.0)
Platelets: 229 10*3/uL (ref 150–400)
RBC: 4.51 MIL/uL (ref 4.22–5.81)
RDW: 12.1 % (ref 11.5–15.5)
WBC: 15.9 10*3/uL — AB (ref 4.0–10.5)

## 2017-12-26 LAB — SURGICAL PCR SCREEN
MRSA, PCR: NEGATIVE
Staphylococcus aureus: POSITIVE — AB

## 2017-12-26 LAB — LIPASE, BLOOD: Lipase: 29 U/L (ref 11–51)

## 2017-12-26 SURGERY — APPENDECTOMY, LAPAROSCOPIC
Anesthesia: General

## 2017-12-26 MED ORDER — PROPOFOL 10 MG/ML IV BOLUS
INTRAVENOUS | Status: DC | PRN
  Administered 2017-12-26: 120 mg via INTRAVENOUS
  Administered 2017-12-26: 80 mg via INTRAVENOUS

## 2017-12-26 MED ORDER — BUPIVACAINE-EPINEPHRINE 0.25% -1:200000 IJ SOLN
INTRAMUSCULAR | Status: DC | PRN
  Administered 2017-12-26: 17 mL

## 2017-12-26 MED ORDER — SODIUM CHLORIDE 0.9 % IV SOLN
INTRAVENOUS | Status: DC | PRN
  Administered 2017-12-26: 20:00:00 via INTRAVENOUS

## 2017-12-26 MED ORDER — SUCCINYLCHOLINE 20MG/ML (10ML) SYRINGE FOR MEDFUSION PUMP - OPTIME
INTRAMUSCULAR | Status: DC | PRN
  Administered 2017-12-26: 100 mg via INTRAVENOUS

## 2017-12-26 MED ORDER — PIPERACILLIN-TAZOBACTAM 3.375 G IVPB
3.3750 g | Freq: Three times a day (TID) | INTRAVENOUS | Status: DC
  Administered 2017-12-26 (×2): 3.375 g via INTRAVENOUS
  Filled 2017-12-26 (×5): qty 50

## 2017-12-26 MED ORDER — TRAMADOL HCL 50 MG PO TABS: 50.0000 mg | ORAL_TABLET | Freq: Four times a day (QID) | ORAL | Status: DC | PRN

## 2017-12-26 MED ORDER — MIDAZOLAM HCL 2 MG/2ML IJ SOLN
INTRAMUSCULAR | Status: AC
  Filled 2017-12-26: qty 2

## 2017-12-26 MED ORDER — SODIUM CHLORIDE 0.9 % IV SOLN
2.0000 g | Freq: Once | INTRAVENOUS | Status: DC
  Administered 2017-12-26: 2 g via INTRAVENOUS

## 2017-12-26 MED ORDER — PROMETHAZINE HCL 25 MG/ML IJ SOLN: 6.2500 mg | INTRAMUSCULAR | Status: DC | PRN

## 2017-12-26 MED ORDER — MIDAZOLAM HCL 2 MG/2ML IJ SOLN
INTRAMUSCULAR | Status: DC | PRN
  Administered 2017-12-26: 2 mg via INTRAVENOUS

## 2017-12-26 MED ORDER — SCOPOLAMINE 1 MG/3DAYS TD PT72
MEDICATED_PATCH | TRANSDERMAL | Status: AC
  Filled 2017-12-26: qty 1

## 2017-12-26 MED ORDER — CLONAZEPAM 0.5 MG PO TABS
0.5000 mg | ORAL_TABLET | Freq: Two times a day (BID) | ORAL | Status: DC
  Administered 2017-12-26 – 2017-12-27 (×2): 0.5 mg via ORAL
  Filled 2017-12-26 (×2): qty 1

## 2017-12-26 MED ORDER — METOPROLOL SUCCINATE ER 25 MG PO TB24
25.0000 mg | ORAL_TABLET | Freq: Every day | ORAL | Status: DC
  Administered 2017-12-27: 25 mg via ORAL
  Filled 2017-12-26: qty 1

## 2017-12-26 MED ORDER — OXYCODONE HCL 5 MG PO TABS
5.0000 mg | ORAL_TABLET | ORAL | Status: DC | PRN
  Administered 2017-12-26 – 2017-12-27 (×2): 10 mg via ORAL
  Filled 2017-12-26 (×2): qty 2

## 2017-12-26 MED ORDER — ACETAMINOPHEN 500 MG PO TABS
1000.0000 mg | ORAL_TABLET | Freq: Four times a day (QID) | ORAL | Status: DC
  Administered 2017-12-26 – 2017-12-27 (×2): 1000 mg via ORAL
  Filled 2017-12-26 (×2): qty 2

## 2017-12-26 MED ORDER — FLUVOXAMINE MALEATE 50 MG PO TABS
25.0000 mg | ORAL_TABLET | Freq: Every day | ORAL | Status: DC
  Administered 2017-12-27: 25 mg via ORAL
  Filled 2017-12-26 (×2): qty 1

## 2017-12-26 MED ORDER — 0.9 % SODIUM CHLORIDE (POUR BTL) OPTIME
TOPICAL | Status: DC | PRN
  Administered 2017-12-26: 1000 mL

## 2017-12-26 MED ORDER — HYDRALAZINE HCL 20 MG/ML IJ SOLN: 10.0000 mg | INTRAMUSCULAR | Status: DC | PRN

## 2017-12-26 MED ORDER — FAMOTIDINE 20 MG PO TABS
20.0000 mg | ORAL_TABLET | Freq: Every day | ORAL | Status: DC
  Administered 2017-12-27: 20 mg via ORAL
  Filled 2017-12-26: qty 1

## 2017-12-26 MED ORDER — LACTATED RINGERS IV SOLN
INTRAVENOUS | Status: DC | PRN
  Administered 2017-12-26: 19:00:00 via INTRAVENOUS

## 2017-12-26 MED ORDER — LACTATED RINGERS IV SOLN
INTRAVENOUS | Status: DC
  Administered 2017-12-26: 19:00:00 via INTRAVENOUS

## 2017-12-26 MED ORDER — HYDROMORPHONE HCL 1 MG/ML IJ SOLN
0.5000 mg | Freq: Once | INTRAMUSCULAR | Status: AC
  Administered 2017-12-26: 0.5 mg via INTRAVENOUS
  Filled 2017-12-26: qty 1

## 2017-12-26 MED ORDER — ALPRAZOLAM 0.25 MG PO TABS: 0.2500 mg | ORAL_TABLET | Freq: Two times a day (BID) | ORAL | Status: DC | PRN

## 2017-12-26 MED ORDER — BUSPIRONE HCL 15 MG PO TABS
15.0000 mg | ORAL_TABLET | Freq: Two times a day (BID) | ORAL | Status: DC
  Administered 2017-12-27 (×2): 15 mg via ORAL
  Filled 2017-12-26: qty 1.5
  Filled 2017-12-26 (×2): qty 1

## 2017-12-26 MED ORDER — ROCURONIUM BROMIDE 10 MG/ML (PF) SYRINGE
PREFILLED_SYRINGE | INTRAVENOUS | Status: AC
  Filled 2017-12-26: qty 10

## 2017-12-26 MED ORDER — DIPHENHYDRAMINE HCL 50 MG/ML IJ SOLN: 25.0000 mg | Freq: Four times a day (QID) | INTRAMUSCULAR | Status: DC | PRN

## 2017-12-26 MED ORDER — INSULIN PUMP
Freq: Three times a day (TID) | SUBCUTANEOUS | Status: DC
  Administered 2017-12-27: 1.8 via SUBCUTANEOUS
  Filled 2017-12-26: qty 1

## 2017-12-26 MED ORDER — SCOPOLAMINE 1 MG/3DAYS TD PT72
MEDICATED_PATCH | TRANSDERMAL | Status: DC | PRN
  Administered 2017-12-26: 1 via TRANSDERMAL

## 2017-12-26 MED ORDER — MUPIROCIN 2 % EX OINT
TOPICAL_OINTMENT | Freq: Two times a day (BID) | CUTANEOUS | Status: DC
  Administered 2017-12-26 – 2017-12-27 (×2): via NASAL
  Filled 2017-12-26: qty 22

## 2017-12-26 MED ORDER — OXYCODONE HCL 5 MG/5ML PO SOLN: 5.0000 mg | Freq: Once | ORAL | Status: DC | PRN

## 2017-12-26 MED ORDER — HYDROMORPHONE HCL 1 MG/ML IJ SOLN: 0.5000 mg | INTRAMUSCULAR | Status: DC | PRN

## 2017-12-26 MED ORDER — ONDANSETRON HCL 4 MG/2ML IJ SOLN
INTRAMUSCULAR | Status: DC | PRN
  Administered 2017-12-26: 4 mg via INTRAVENOUS

## 2017-12-26 MED ORDER — MORPHINE SULFATE (PF) 4 MG/ML IV SOLN
2.0000 mg | INTRAVENOUS | Status: DC | PRN
  Administered 2017-12-26: 2 mg via INTRAVENOUS
  Filled 2017-12-26 (×2): qty 1

## 2017-12-26 MED ORDER — OXYCODONE HCL 5 MG PO TABS: 5.0000 mg | ORAL_TABLET | Freq: Once | ORAL | Status: DC | PRN

## 2017-12-26 MED ORDER — KCL IN DEXTROSE-NACL 20-5-0.45 MEQ/L-%-% IV SOLN: INTRAVENOUS | Status: DC

## 2017-12-26 MED ORDER — SUCCINYLCHOLINE CHLORIDE 200 MG/10ML IV SOSY
PREFILLED_SYRINGE | INTRAVENOUS | Status: AC
  Filled 2017-12-26: qty 10

## 2017-12-26 MED ORDER — DIPHENHYDRAMINE HCL 25 MG PO CAPS: 25.0000 mg | ORAL_CAPSULE | Freq: Four times a day (QID) | ORAL | Status: DC | PRN

## 2017-12-26 MED ORDER — VALACYCLOVIR HCL 500 MG PO TABS
1000.0000 mg | ORAL_TABLET | Freq: Three times a day (TID) | ORAL | Status: DC
  Administered 2017-12-27: 1000 mg via ORAL
  Filled 2017-12-26 (×4): qty 2

## 2017-12-26 MED ORDER — ONDANSETRON HCL 4 MG/2ML IJ SOLN
4.0000 mg | Freq: Four times a day (QID) | INTRAMUSCULAR | Status: DC | PRN
  Filled 2017-12-26: qty 2

## 2017-12-26 MED ORDER — SODIUM CHLORIDE 0.9 % IV SOLN
INTRAVENOUS | Status: DC
  Administered 2017-12-26: 15:00:00 via INTRAVENOUS

## 2017-12-26 MED ORDER — LIDOCAINE HCL (CARDIAC) PF 100 MG/5ML IV SOSY
PREFILLED_SYRINGE | INTRAVENOUS | Status: DC | PRN
  Administered 2017-12-26: 100 mg via INTRATRACHEAL

## 2017-12-26 MED ORDER — SIMETHICONE 80 MG PO CHEW
40.0000 mg | CHEWABLE_TABLET | Freq: Four times a day (QID) | ORAL | Status: DC | PRN
  Filled 2017-12-26: qty 1

## 2017-12-26 MED ORDER — ONDANSETRON 4 MG PO TBDP: 4.0000 mg | ORAL_TABLET | Freq: Four times a day (QID) | ORAL | Status: DC | PRN

## 2017-12-26 MED ORDER — ONDANSETRON HCL 4 MG/2ML IJ SOLN
INTRAMUSCULAR | Status: AC
  Filled 2017-12-26: qty 2

## 2017-12-26 MED ORDER — LIDOCAINE 2% (20 MG/ML) 5 ML SYRINGE
INTRAMUSCULAR | Status: AC
  Filled 2017-12-26: qty 5

## 2017-12-26 MED ORDER — PROPOFOL 10 MG/ML IV BOLUS
INTRAVENOUS | Status: AC
  Filled 2017-12-26: qty 20

## 2017-12-26 MED ORDER — BUPIVACAINE-EPINEPHRINE (PF) 0.25% -1:200000 IJ SOLN
INTRAMUSCULAR | Status: AC
  Filled 2017-12-26: qty 30

## 2017-12-26 MED ORDER — METRONIDAZOLE IN NACL 5-0.79 MG/ML-% IV SOLN
500.0000 mg | Freq: Once | INTRAVENOUS | Status: DC
  Filled 2017-12-26: qty 100

## 2017-12-26 MED ORDER — ROCURONIUM 10MG/ML (10ML) SYRINGE FOR MEDFUSION PUMP - OPTIME
INTRAVENOUS | Status: DC | PRN
  Administered 2017-12-26: 40 mg via INTRAVENOUS

## 2017-12-26 MED ORDER — HEPARIN SODIUM (PORCINE) 5000 UNIT/ML IJ SOLN
5000.0000 [IU] | Freq: Three times a day (TID) | INTRAMUSCULAR | Status: DC
  Administered 2017-12-26 – 2017-12-27 (×2): 5000 [IU] via SUBCUTANEOUS
  Filled 2017-12-26 (×2): qty 1

## 2017-12-26 MED ORDER — SUGAMMADEX SODIUM 200 MG/2ML IV SOLN
INTRAVENOUS | Status: DC | PRN
  Administered 2017-12-26: 200 mg via INTRAVENOUS

## 2017-12-26 MED ORDER — LORAZEPAM 1 MG PO TABS: 1.0000 mg | ORAL_TABLET | Freq: Three times a day (TID) | ORAL | Status: DC

## 2017-12-26 MED ORDER — FENTANYL CITRATE (PF) 250 MCG/5ML IJ SOLN
INTRAMUSCULAR | Status: DC | PRN
  Administered 2017-12-26 (×3): 50 ug via INTRAVENOUS
  Administered 2017-12-26: 100 ug via INTRAVENOUS

## 2017-12-26 MED ORDER — SUGAMMADEX SODIUM 200 MG/2ML IV SOLN
INTRAVENOUS | Status: AC
  Filled 2017-12-26: qty 2

## 2017-12-26 MED ORDER — IOPAMIDOL (ISOVUE-300) INJECTION 61%
100.0000 mL | Freq: Once | INTRAVENOUS | Status: AC | PRN
  Administered 2017-12-26: 100 mL via INTRAVENOUS

## 2017-12-26 MED ORDER — SODIUM CHLORIDE 0.9 % IV SOLN
INTRAVENOUS | Status: DC
  Administered 2017-12-26: 22:00:00 via INTRAVENOUS

## 2017-12-26 MED ORDER — FENTANYL CITRATE (PF) 250 MCG/5ML IJ SOLN
INTRAMUSCULAR | Status: AC
  Filled 2017-12-26: qty 5

## 2017-12-26 MED ORDER — FENTANYL CITRATE (PF) 100 MCG/2ML IJ SOLN: 25.0000 ug | INTRAMUSCULAR | Status: DC | PRN

## 2017-12-26 SURGICAL SUPPLY — 38 items
APPLIER CLIP 5 13 M/L LIGAMAX5 (MISCELLANEOUS)
BLADE CLIPPER SURG (BLADE) ×2 IMPLANT
CANISTER SUCT 3000ML PPV (MISCELLANEOUS) ×2 IMPLANT
CHLORAPREP W/TINT 26ML (MISCELLANEOUS) ×2 IMPLANT
CLIP APPLIE 5 13 M/L LIGAMAX5 (MISCELLANEOUS) IMPLANT
COVER SURGICAL LIGHT HANDLE (MISCELLANEOUS) ×2 IMPLANT
CUTTER FLEX LINEAR 45M (STAPLE) ×2 IMPLANT
DERMABOND ADVANCED (GAUZE/BANDAGES/DRESSINGS) ×1
DERMABOND ADVANCED .7 DNX12 (GAUZE/BANDAGES/DRESSINGS) ×1 IMPLANT
ELECT REM PT RETURN 9FT ADLT (ELECTROSURGICAL) ×2
ELECTRODE REM PT RTRN 9FT ADLT (ELECTROSURGICAL) ×1 IMPLANT
GLOVE BIO SURGEON STRL SZ7.5 (GLOVE) ×2 IMPLANT
GLOVE INDICATOR 8.0 STRL GRN (GLOVE) ×2 IMPLANT
GOWN STRL REUS W/ TWL LRG LVL3 (GOWN DISPOSABLE) ×2 IMPLANT
GOWN STRL REUS W/ TWL XL LVL3 (GOWN DISPOSABLE) ×1 IMPLANT
GOWN STRL REUS W/TWL LRG LVL3 (GOWN DISPOSABLE) ×2
GOWN STRL REUS W/TWL XL LVL3 (GOWN DISPOSABLE) ×1
KIT BASIN OR (CUSTOM PROCEDURE TRAY) ×2 IMPLANT
KIT TURNOVER KIT B (KITS) ×2 IMPLANT
NS IRRIG 1000ML POUR BTL (IV SOLUTION) ×2 IMPLANT
PAD ARMBOARD 7.5X6 YLW CONV (MISCELLANEOUS) ×4 IMPLANT
POUCH SPECIMEN RETRIEVAL 10MM (ENDOMECHANICALS) ×2 IMPLANT
RELOAD 45 VASCULAR/THIN (ENDOMECHANICALS) IMPLANT
RELOAD STAPLE TA45 3.5 REG BLU (ENDOMECHANICALS) ×2 IMPLANT
SCISSORS LAP 5X35 DISP (ENDOMECHANICALS) IMPLANT
SET IRRIG TUBING LAPAROSCOPIC (IRRIGATION / IRRIGATOR) ×2 IMPLANT
SHEARS HARMONIC ACE PLUS 36CM (ENDOMECHANICALS) ×2 IMPLANT
SLEEVE ENDOPATH XCEL 5M (ENDOMECHANICALS) ×2 IMPLANT
SPECIMEN JAR SMALL (MISCELLANEOUS) ×2 IMPLANT
SUT MNCRL AB 4-0 PS2 18 (SUTURE) ×2 IMPLANT
TOWEL OR 17X24 6PK STRL BLUE (TOWEL DISPOSABLE) ×2 IMPLANT
TOWEL OR 17X26 10 PK STRL BLUE (TOWEL DISPOSABLE) ×2 IMPLANT
TRAY FOLEY CATH SILVER 16FR (SET/KITS/TRAYS/PACK) ×2 IMPLANT
TRAY LAPAROSCOPIC MC (CUSTOM PROCEDURE TRAY) ×2 IMPLANT
TROCAR XCEL BLUNT TIP 100MML (ENDOMECHANICALS) ×2 IMPLANT
TROCAR XCEL NON-BLD 5MMX100MML (ENDOMECHANICALS) ×2 IMPLANT
TUBING INSUFFLATION (TUBING) ×2 IMPLANT
WATER STERILE IRR 1000ML POUR (IV SOLUTION) ×2 IMPLANT

## 2017-12-26 NOTE — ED Triage Notes (Signed)
Pt reports that he has appendicitis per [place] [facility] with CT scan.

## 2017-12-26 NOTE — ED Notes (Signed)
Surgery, PA at bedside.

## 2017-12-26 NOTE — Progress Notes (Signed)
Patient seen and examined  32yoF with hx of T1DM presented to ED today with 1d hx of RLQ abdominal pain, sharp, nonradiating. Never had this before. No n/v but decreased appetite. Denies f/c. Nothing made the pain better including PO tylenol and a suppository.  AF, VS normal NAD, comfortable RRR Normal work of breathing Abd soft, moderately ttp in RLQ; negative Rovsing's. No rebound/guarding  WBC 16  BUN 20 Cr 1.29 Glucose 291-->283  CT A/P today showed acute appendicitis with 13mm appendix and appendicolith, without evidence of perforation or abscess.  A/P -Plan for laparoscopic appendectomy -The anatomy and physiology of the GI tract was discussed at length with the patient and his family. The pathophysiology of acute appendicitis was discussed at length with associated pictures. -Options for treatment were discussed including both operative and nonoperative treatment. Nonoperative being with antibiotics which is quite successful but with risk of recurrence over next 4-5 years. -He elected to pursue surgical management - the planned procedure, material risks (including, but not limited to, pain, bleeding, infection, scarring, conversion to open procedure, need for blood transfusion, damage to surrounding structures- blood vessels/nerves/viscus/organs, damage to ureter, urine leak, leak from staple line, need for additional procedures, need for stoma, hernia, recurrence, pneumonia, heart attack, stroke, death) benefits and alternatives to surgery were discussed at length. I noted a good probability that the procedure would help improve his symptoms. The patient's questions were answered to his satisfaction, he voiced understanding and elected to proceed with surgery. Additionally, we discussed typical postoperative expectations and the recovery process.  Stephanie Couphristopher M. Cliffton AstersWhite, M.D. [facility]

## 2017-12-26 NOTE — Progress Notes (Addendum)
Inpatient Diabetes Program Recommendations  AACE/ADA: New Consensus Statement on Inpatient Glycemic Control (2015)  Target Ranges:  Prepandial:   less than 140 mg/dL      Peak postprandial:   less than 180 mg/dL (1-2 hours)      Critically ill patients:  140 - 180 mg/dL   Lab Results  Component Value Date   GLUCAP 283 (H) 12/26/2017   Review of Glycemic Control  Diabetes history: DM 1, since the age of 3 Outpatient Diabetes medications: Insulin pump Medtronic, Humalog insulin Current orders for Inpatient glycemic control: Insulin Pump Order set  Inpatient Diabetes Program Recommendations:    Basal rate 1.1 units/hour  Total basal in a 24 hour period 26.4 units  1 unit of insulin for every 10 grams of carbs 1 units drops patient's glucose 40 mg/dl  Glucose target 409135 Active insulin time 3 hours  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 Patient's last A1c was 10% at his PCP office on 6/26. Dr. Jacky KindleAronson started patient on [facility] continuous glucose monitor, patient said he has Medicaid and charged $55 for 2 sensors. Patient only got one and it lasted 2 days.  Patient says he feels low at 90. Patient maybe difficult to control glucose post op. Current glucose is 280's.  Thanks,  Christena DeemShannon Zamaya Rapaport RN, MSN, BC-ADM, [facility] Inpatient Diabetes Coordinator Team Pager [phone] (8a-5p)

## 2017-12-26 NOTE — ED Notes (Signed)
Diabetes coordinator at the bedside.  °

## 2017-12-26 NOTE — H&P (Signed)
[facility] Admission Note Calvin Baxter Apr 24, 1986  [id number].    Requesting MD: Rex Kras, MD Chief Complaint/Reason for Consult: appendicitis   HPI:  32 y/o M with PMH type 1 DM and anxiety who presented to [facility] with acute onset abdominal pain. Pain started last night. Pain was initially sharp, periumbilical pain that localized to his RLQ. Denies aggravating or alleviating factors. Tried a suppository but this did not help. Denies fever, chills, nausea, vomiting. NKDA. Denies illicit drug use. No prior history of abdominal surgery. Nonsmoker. Works as a caddy and in Personal assistant.  ED workup significant for WBC 15,900, Creatinine 1.29, and CT abd/pelv showing acute appendicitis without perforation/abscess.   ROS: Review of Systems  Constitutional: Negative.   HENT: Negative.   Eyes: Negative.   Respiratory: Negative.   Cardiovascular: Negative.   Gastrointestinal: Positive for abdominal pain and constipation. Negative for nausea and vomiting.  Genitourinary: Negative.   Musculoskeletal: Negative.   Skin: Negative.   Neurological: Negative.   All other systems reviewed and are negative.   Family History  Problem Relation Age of Onset  . Breast cancer Maternal Grandmother   . Colon cancer Neg Hx   . Liver cancer Neg Hx   . Rectal cancer Neg Hx   . Stomach cancer Neg Hx   . Esophageal cancer Neg Hx     Past Medical History:  Diagnosis Date  . Alcoholism ([place])   . Anxiety, generalized    on lexapro  . Diabetes mellitus without complication ([place])    type 1  . Dizziness   . Neuropathy    diagnosed by Dr Blenda Mounts per patient    Past Surgical History:  Procedure Laterality Date  . None      Social History:  reports that he has quit smoking. He has never used smokeless tobacco. He reports that he does not drink alcohol or use drugs.  Allergies: No Known Allergies   (Not in a hospital admission)  Blood pressure 125/88, pulse 84, temperature 98.3 F (36.8  C), temperature source Oral, resp. rate 16, height _0  (1.727 m), weight 76.2 kg (168 lb), SpO2 98 %. Physical Exam: Physical Exam  Constitutional: He is oriented to person, place, and time. He appears well-developed and well-nourished. No distress.  HENT:  Head: Normocephalic and atraumatic.  Right Ear: External ear normal.  Left Ear: External ear normal.  Nose: Nose normal.  Mouth/Throat: Oropharynx is clear and moist.  Eyes: Pupils are equal, round, and reactive to light. Conjunctivae and EOM are normal. No scleral icterus.  Neck: Normal range of motion. Neck supple. No tracheal deviation present.  Cardiovascular: Normal rate, regular rhythm, normal heart sounds and intact distal pulses.  Pulmonary/Chest: Effort normal and breath sounds normal. No stridor. No respiratory distress. He has no wheezes. He has no rales.  Abdominal: Soft. Bowel sounds are normal. He exhibits no distension and no mass. There is no hepatosplenomegaly. There is tenderness in the right lower quadrant. There is no rigidity, no rebound and no guarding. No hernia.  Musculoskeletal:  Calves soft and nontender  Neurological: He is alert and oriented to person, place, and time. No cranial nerve deficit.  Skin: Skin is warm and dry. No rash noted. He is not diaphoretic.  Psychiatric: He has a normal mood and affect. His behavior is normal. Judgment and thought content normal.  Nursing note and vitals reviewed.   Results for orders placed or performed during the hospital encounter of 12/26/17 (from the past 48 hour(s))  Lipase, blood     Status: None   Collection Time: 12/26/17  1:07 PM  Result Value Ref Range   Lipase 29 11 - 51 U/L    Comment: Performed at [facility], [place] [address] [place], [place] [address]  Comprehensive metabolic panel     Status: Abnormal   Collection Time: 12/26/17  1:07 PM  Result Value Ref Range   Sodium 137 135 - 145 mmol/L   Potassium 4.5 3.5 - 5.1 mmol/L   Chloride 102 98  - 111 mmol/L    Comment: Please note change in reference range.   CO2 28 22 - 32 mmol/L   Glucose, Bld 291 (H) 70 - 99 mg/dL    Comment: Please note change in reference range.   BUN 20 6 - 20 mg/dL    Comment: Please note change in reference range.   Creatinine, Ser 1.29 (H) 0.61 - 1.24 mg/dL   Calcium 9.4 8.9 - 10.3 mg/dL   Total Protein 7.0 6.5 - 8.1 g/dL   Albumin 4.0 3.5 - 5.0 g/dL   AST 24 15 - 41 U/L   ALT 28 0 - 44 U/L    Comment: Please note change in reference range.   Alkaline Phosphatase 81 38 - 126 U/L   Total Bilirubin 1.3 (H) 0.3 - 1.2 mg/dL   GFR calc non Af Amer >60 >60 mL/min   GFR calc Af Amer >60 >60 mL/min    Comment: (NOTE) The eGFR has been calculated using the CKD EPI equation. This calculation has not been validated in all clinical situations. eGFR's persistently <60 mL/min signify possible Chronic Kidney Disease.    Anion gap 7 5 - 15    Comment: Performed at [facility] [address], [place], [place] [address]  CBC     Status: Abnormal   Collection Time: 12/26/17  1:07 PM  Result Value Ref Range   WBC 15.9 (H) 4.0 - 10.5 K/uL   RBC 4.51 4.22 - 5.81 MIL/uL   Hemoglobin 14.6 13.0 - 17.0 g/dL   HCT 41.3 39.0 - 52.0 %   MCV 91.6 78.0 - 100.0 fL   MCH 32.4 26.0 - 34.0 pg   MCHC 35.4 30.0 - 36.0 g/dL   RDW 12.1 11.5 - 15.5 %   Platelets 229 150 - 400 K/uL    Comment: Performed at [facility], [place] [address] [place], [place] [address]   Ct Abdomen Pelvis W Contrast  Result Date: 12/26/2017 CLINICAL DATA:  istat b/c diabetic, creatinine 1.2 bun 27 gfr 79 160m isovue 300 STAT Call [phone] rlq pain sharp since last night no surgeries leukocytosis no history cancer EXAM: CT ABDOMEN AND PELVIS WITH CONTRAST TECHNIQUE: Multidetector CT imaging of the abdomen and pelvis was performed using the standard protocol following bolus administration of intravenous contrast. CONTRAST:  1079mISOVUE-300 IOPAMIDOL (ISOVUE-300) INJECTION 61% Creatinine  [facility] [place] [facility] [address] Results: Creatinine 1.2 mg/dL.  BUN 27.  GFR 79 COMPARISON:  None. FINDINGS: Lower chest: No acute abnormality. Hepatobiliary: No focal liver abnormality is seen. No gallstones, gallbladder wall thickening, or biliary dilatation. Pancreas: Unremarkable. No pancreatic ductal dilatation or surrounding inflammatory changes. Spleen: Normal in size without focal abnormality. Adrenals/Urinary Tract: Adrenal glands are unremarkable. Kidneys are normal, without renal calculi, focal lesion, or hydronephrosis. Bladder is unremarkable. Stomach/Bowel: Stomach is partially distended. Small bowel nondilated. Appendix: Location: Retrocecal Diameter: Up to 1.3 cm Appendicolith: Present  Mucosal hyper-enhancement: Patchy Extraluminal gas: None Periappendiceal collection: No abscess. Regional inflammatory/edematous change. Moderate colonic fecal material without dilatation. Vascular/Lymphatic: No significant vascular findings are present. No enlarged abdominal or pelvic lymph nodes. Reproductive: Prostate is unremarkable. Other: No ascites.  No free air. Musculoskeletal: No acute or significant osseous findings. IMPRESSION: 1. Acute appendicitis without CT evidence of perforation or abscess. Critical Value/emergent results were called by telephone at the time of interpretation on 12/26/2017 at 12:32 pm to [facility] RN for Dr. Burnard Bunting , who verbally acknowledged these results. Electronically Signed   By: Lucrezia Europe M.D.   On: 12/26/2017 12:35   Assessment/Plan Diabetes mellitus type I - insulin pump, will discuss perioperative blood glucose control with pharmacy/DM coordinator  Anxiety - home meds  Acute appendicitis - NPO, IVF, Zosyn, OR for laparoscopic appendectomy tonight.    Wellington Hampshire, [facility] 12/26/2017, 2:57 PM Pager: [phone] Consults: [phone] Mon 7:00 am -11:30 AM Tues-Fri 7:00 am-4:30 pm Sat-Sun 7:00  am-11:30 am

## 2017-12-26 NOTE — Anesthesia Procedure Notes (Signed)
Procedure Name: Intubation Date/Time: 12/26/2017 7:24 PM Performed by: Claris Che, CRNA Pre-anesthesia Checklist: Patient identified, Emergency Drugs available, Suction available, Patient being monitored and Timeout performed Patient Re-evaluated:Patient Re-evaluated prior to induction Oxygen Delivery Method: Circle system utilized Preoxygenation: Pre-oxygenation with 100% oxygen Induction Type: IV induction, Rapid sequence and Cricoid Pressure applied Laryngoscope Size: Mac and 3 Grade View: Grade I Tube type: Oral Tube size: 7.5 mm Number of attempts: 1 Airway Equipment and Method: Stylet Placement Confirmation: ETT inserted through vocal cords under direct vision,  positive ETCO2 and breath sounds checked- equal and bilateral Secured at: 23 cm Tube secured with: Tape

## 2017-12-26 NOTE — Anesthesia Postprocedure Evaluation (Signed)
Anesthesia Post Note  Patient: Calvin Baxter  Procedure(s) Performed: APPENDECTOMY LAPAROSCOPIC (N/A )     Patient location during evaluation: PACU Anesthesia Type: General Level of consciousness: awake and alert, oriented and patient cooperative Pain management: pain level controlled Vital Signs Assessment: post-procedure vital signs reviewed and stable Respiratory status: spontaneous breathing, nonlabored ventilation and respiratory function stable Cardiovascular status: blood pressure returned to baseline and stable Postop Assessment: no apparent nausea or vomiting Anesthetic complications: no    Last Vitals:  Vitals:   12/26/17 2130 12/26/17 2149  BP: 137/84 (!) 142/85  Pulse: 94 (!) 103  Resp: 16 18  Temp: 36.5 C 37.6 C  SpO2: 96% 94%    Last Pain:  Vitals:   12/26/17 2216  TempSrc:   PainSc: 3                  Favian Kittleson,E. Hilary Milks

## 2017-12-26 NOTE — Progress Notes (Signed)
Inpatient Diabetes Program   Patient has signed and been given copy of consent for CGM/Freestyle [name] research study. Education done regarding application and changing CGM sensor (alternate every 14 days on back of arms), 1 hour warm-up, use of glucometer/where to buy strips, how to scan CGM for glucose reading and information for PCP. Patient has been given [name] reader and 2 sensors for use. Patient has also been given educational packet regarding use CGM sensor including the 1-800 toll free number for any questions, problems or needs related to the [name].  Sensor to be applied 7/4 after discharge by.  Explained that glucose readings will not be available until 1 hour after application. Patient understands that Diabetes coordinator will call them 2 times after discharge (between days 7-12 after d/c from hospital and between days 30-25 after d/c from hospital). Patient verbalizes understanding of use of [name] and was told that any issues with blood sugars/diabetes will need to be addressed by PCP.  Diabetes Quality of Life Survey administered to patient.   Thanks,  Calvin DeemShannon Sotiria Keast RN, MSN, BC-ADM, [facility] Inpatient Diabetes Coordinator Team Pager [phone] (8a-5p)

## 2017-12-26 NOTE — Anesthesia Preprocedure Evaluation (Signed)
Anesthesia Evaluation  Patient identified by MRN, date of birth, ID band Patient awake    Reviewed: Allergy & Precautions, NPO status , Patient's Chart, lab work & pertinent test results  Airway Mallampati: II  TM Distance: >3 FB Neck ROM: Full    Dental  (+) Dental Advisory Given   Pulmonary former smoker,    breath sounds clear to auscultation       Cardiovascular negative cardio ROS   Rhythm:Regular Rate:Normal     Neuro/Psych Anxiety Neuropathy     GI/Hepatic negative GI ROS, (+)     substance abuse  alcohol use,   Endo/Other  diabetes, Type 1, Insulin DependentInsulin pump   Renal/GU Renal InsufficiencyRenal disease  negative genitourinary   Musculoskeletal negative musculoskeletal ROS (+)   Abdominal   Peds  Hematology negative hematology ROS (+)   Anesthesia Other Findings   Reproductive/Obstetrics                             Anesthesia Physical Anesthesia Plan  ASA: III  Anesthesia Plan: General   Post-op Pain Management:    Induction: Intravenous and Rapid sequence  PONV Risk Score and Plan: 4 or greater and Treatment may vary due to age or medical condition, Ondansetron, Midazolam and Scopolamine patch - Pre-op  Airway Management Planned: Oral ETT  Additional Equipment: None  Intra-op Plan:   Post-operative Plan: Extubation in OR  Informed Consent: I have reviewed the patients History and Physical, chart, labs and discussed the procedure including the risks, benefits and alternatives for the proposed anesthesia with the patient or authorized representative who has indicated his/her understanding and acceptance.   Dental advisory given  Plan Discussed with: CRNA and Anesthesiologist  Anesthesia Plan Comments:         Anesthesia Quick Evaluation

## 2017-12-26 NOTE — ED Notes (Signed)
Attempted report 

## 2017-12-26 NOTE — Transfer of Care (Signed)
Immediate Anesthesia Transfer of Care Note  Patient: Calvin Baxter  Procedure(s) Performed: APPENDECTOMY LAPAROSCOPIC (N/A )  Patient Location: PACU  Anesthesia Type:General  Level of Consciousness: sedated, drowsy, patient cooperative and responds to stimulation  Airway & Oxygen Therapy: Patient Spontanous Breathing and Patient connected to nasal cannula oxygen  Post-op Assessment: Report given to RN, Post -op Vital signs reviewed and stable and Patient moving all extremities X 4  Post vital signs: Reviewed and stable  Last Vitals:  Vitals Value Taken Time  BP 137/86 12/26/2017  9:14 PM  Temp 36.5 C 12/26/2017  9:14 PM  Pulse 100 12/26/2017  9:17 PM  Resp 20 12/26/2017  9:17 PM  SpO2 93 % 12/26/2017  9:17 PM  Vitals shown include unvalidated device data.  Last Pain:  Vitals:   12/26/17 2114  TempSrc:   PainSc: Asleep         Complications: No apparent anesthesia complications

## 2017-12-26 NOTE — ED Notes (Signed)
Dr. Wakefield at bedside. 

## 2017-12-26 NOTE — Progress Notes (Signed)
Arrived to 6n1 from ED. Family at bedside, oriented to room and surroundings, pt aware of NPO.Denies pain\nausea at this time.

## 2017-12-26 NOTE — ED Provider Notes (Signed)
[facility] EMERGENCY DEPARTMENT Provider Note   CSN: [id number] Arrival date & time: 12/26/17  1252     History   Chief Complaint Chief Complaint  Patient presents with  . appendicitis/per [facility]    HPI Calvin Baxter is a 32 y.o. male.  HPI   32 year old male presents today with complaints of abdominal pain.  Patient notes last night he developed severe diffuse lower abdominal pain that has localized to his right lower quadrant today.  He denies any vomiting, denies any fever.  Patient notes that he was constipated and used a suppository yesterday.  Patient denies any changes in urine.  No history of abdominal surgeries.  Patient does have prior history of alcoholism but reports that he does not drink now.  He notes he ate around 8:00 this morning having a bowl of cereal.  He had outpatient imaging showing acute appendicitis.  Past Medical History:  Diagnosis Date  . Alcoholism (HCC)   . Anxiety, generalized    on lexapro  . Diabetes mellitus without complication (HCC)    type 1  . Dizziness   . Neuropathy    diagnosed by Dr Ralene Cork per patient    Patient Active Problem List   Diagnosis Date Noted  . Acute appendicitis, uncomplicated 12/26/2017  . Nausea without vomiting 06/28/2017  . Alcoholism (HCC) 06/28/2017  . Type I diabetes mellitus with complication, uncontrolled (HCC) 06/28/2017  . Dizziness 03/13/2014    Past Surgical History:  Procedure Laterality Date  . None          Home Medications    Prior to Admission medications   Medication Sig Start Date End Date Taking? Authorizing Provider  ALPRAZolam Prudy Feeler) 0.25 MG tablet  01/18/16   [provider]  busPIRone (BUSPAR) 30 MG tablet 15 mg 2 (two) times daily.  04/24/17   [provider]  clonazePAM (KLONOPIN) 0.5 MG tablet 2 (two) times daily.  06/13/17   [provider]  fluvoxaMINE (LUVOX) 25 MG tablet at bedtime.  06/11/17   [provider]  gabapentin (NEURONTIN) 400 MG capsule as directed. 400mg  q am. 400mg  q noon and 800mg  qhs 06/08/17   [provider]  glucagon (GLUCAGON EMERGENCY) 1 MG injection Inject 1 mg into the vein once as needed (in emergency situation).    [provider]  insulin aspart protamine- aspart (NOVOLOG MIX 70/30) (70-30) 100 UNIT/ML injection Inject into the skin. Use as directed in insulin pump, max 100 units/day    [provider]  Insulin Disposable Pump MISC by Does not apply route.    [provider]  insulin lispro (HUMALOG) 100 UNIT/ML injection Inject 100 Units into the skin as needed for high blood sugar (3 vials monthly per pump A1C not at goal <7%).    [provider]  LORazepam (ATIVAN) 1 MG tablet Take 1 mg by mouth every 8 (eight) hours.    [provider]  metoprolol succinate (TOPROL-XL) 25 MG 24 hr tablet Take 25 mg by mouth daily.    [provider]  NONFORMULARY OR COMPOUNDED ITEM Apply 1-2 g topically 4 (four) times daily. Peripheral Neuropathy cream: Bupivacaine 1% Doxepin 3% Gabapentin 6% Ibuprofen 3% Pentoxifylline 3% 05/28/17   Park Liter, DPM  ondansetron (ZOFRAN ODT) 8 MG disintegrating tablet Take 1 tablet (8 mg total) by mouth every 8 (eight) hours as needed for nausea or vomiting. 06/28/17   Armbruster, Willaim Rayas, MD  PRESCRIPTION MEDICATION C - peripheral neuropathy  cream - qhs    [provider]  ranitidine (ZANTAC) 150 MG tablet Take 1 tablet (150 mg total) by mouth at bedtime. Can increase to twice a day as needed 06/28/17   Armbruster, Willaim Rayas, MD  sildenafil (REVATIO) 20 MG tablet take 5 tablets by mouth 1 hour prior to sexual activity as needed 03/20/16   [provider]  valACYclovir (VALTREX) 1000 MG tablet Take 1,000 mg by mouth 3 (three) times daily.    [provider]    Family History Family History  Problem Relation Age of Onset  . Breast cancer Maternal  Grandmother   . Colon cancer Neg Hx   . Liver cancer Neg Hx   . Rectal cancer Neg Hx   . Stomach cancer Neg Hx   . Esophageal cancer Neg Hx     Social History Social History   Tobacco Use  . Smoking status: Former Games developer  . Smokeless tobacco: Never Used  . Tobacco comment: quit in 2008  Substance Use Topics  . Alcohol use: No    Frequency: Never    Comment: history of alcoholism  . Drug use: No     Allergies   Patient has no known allergies.   Review of Systems Review of Systems  All other systems reviewed and are negative.    Physical Exam Updated Vital Signs BP 131/87   Pulse 79   Temp 98.3 F (36.8 C) (Oral)   Resp 16   Ht 5\' 8"  (1.727 m)   Wt 76.2 kg (168 lb)   SpO2 98%   BMI 25.54 kg/m   Physical Exam  Constitutional: He is oriented to person, place, and time. He appears well-developed and well-nourished.  HENT:  Head: Normocephalic and atraumatic.  Eyes: Pupils are equal, round, and reactive to light. Conjunctivae are normal. Right eye exhibits no discharge. Left eye exhibits no discharge. No scleral icterus.  Neck: Normal range of motion. No JVD present. No tracheal deviation present.  Pulmonary/Chest: Effort normal. No stridor.  Abdominal:  Exquisite tenderness palpation of right lower quadrant, remainder of abdomen soft nontender  Neurological: He is alert and oriented to person, place, and time. Coordination normal.  Psychiatric: He has a normal mood and affect. His behavior is normal. Judgment and thought content normal.  Nursing note and vitals reviewed.  ED Treatments / Results  Labs (all labs ordered are listed, but only abnormal results are displayed) Labs Reviewed  COMPREHENSIVE METABOLIC PANEL - Abnormal; Notable for the following components:      Result Value   Glucose, Bld 291 (*)    Creatinine, Ser 1.29 (*)    Total Bilirubin 1.3 (*)    All other components within normal limits  CBC - Abnormal; Notable for the following  components:   WBC 15.9 (*)    All other components within normal limits  URINALYSIS, ROUTINE W REFLEX MICROSCOPIC - Abnormal; Notable for the following components:   Specific Gravity, Urine >1.046 (*)    Glucose, UA >=500 (*)    All other components within normal limits  CBG MONITORING, ED - Abnormal; Notable for the following components:   Glucose-Capillary 283 (*)    All other components within normal limits  SURGICAL PCR SCREEN  LIPASE, BLOOD  HIV ANTIBODY (ROUTINE TESTING)    EKG None  Radiology Ct Abdomen Pelvis W Contrast  Result Date: 12/26/2017 CLINICAL DATA:  istat b/c diabetic, creatinine 1.2 bun 27 gfr 79 isovue 300 STAT Call [phone] NP rlq pain  sharp since last night no surgeries leukocytosis no history cancer EXAM: CT ABDOMEN AND PELVIS WITH CONTRAST TECHNIQUE: Multidetector CT imaging of the abdomen and pelvis was performed using the standard protocol following bolus administration of intravenous contrast. CONTRAST:  100mL ISOVUE-300 IOPAMIDOL (ISOVUE-300) INJECTION 61% Creat[facility] at [address] Results: Creatinine 1.2 mg/dL.  BUN 27.  GFR 79 COMPARISON:  None. FINDINGS: Lower chest: No acute abnormality. Hepatobiliary: No focal liver abnormality is seen. No gallstones, gallbladder wall thickening, or biliary dilatation. Pancreas: Unremarkable. No pancreatic ductal dilatation or surrounding inflammatory changes. Spleen: Normal in size without focal abnormality. Adrenals/Urinary Tract: Adrenal glands are unremarkable. Kidneys are normal, without renal calculi, focal lesion, or hydronephrosis. Bladder is unremarkable. Stomach/Bowel: Stomach is partially distended. Small bowel nondilated. Appendix: Location: Retrocecal Diameter: Up to 1.3 cm Appendicolith: Present Mucosal hyper-enhancement: Patchy Extraluminal gas: None Periappendiceal collection: No abscess. Regional inflammatory/edematous change. Moderate colonic fecal  material without dilatation. Vascular/Lymphatic: No significant vascular findings are present. No enlarged abdominal or pelvic lymph nodes. Reproductive: Prostate is unremarkable. Other: No ascites.  No free air. Musculoskeletal: No acute or significant osseous findings. IMPRESSION: 1. Acute appendicitis without CT evidence of perforation or abscess. Critical Value/emergent results were called by telephone at the time of interpretation on 12/26/2017 at 12:32 pm to [facility] for Dr. Geoffry ParadiseICHARD ARONSON , who verbally acknowledged these results. Electronically Signed   By: Corlis Leak  Hassell M.D.   On: 12/26/2017 12:35    Procedures Procedures (including critical care time)  Medications Ordered in ED Medications  piperacillin-tazobactam (ZOSYN) IVPB 3.375 g (3.375 g Intravenous Transfusing/Transfer 12/26/17 1518)  acetaminophen (TYLENOL) tablet 1,000 mg (1,000 mg Oral Not Given 12/26/17 1430)  oxyCODONE (Oxy IR/ROXICODONE) immediate release tablet 5-10 mg (has no administration in time range)  morphine 4 MG/ML injection 2 mg (has no administration in time range)  diphenhydrAMINE (BENADRYL) capsule 25 mg (has no administration in time range)    Or  diphenhydrAMINE (BENADRYL) injection 25 mg (has no administration in time range)  ondansetron (ZOFRAN-ODT) disintegrating tablet 4 mg (has no administration in time range)    Or  ondansetron (ZOFRAN) injection 4 mg (has no administration in time range)  simethicone (MYLICON) chewable tablet 40 mg (has no administration in time range)  hydrALAZINE (APRESOLINE) injection 10 mg (has no administration in time range)  insulin pump (has no administration in time range)  ALPRAZolam (XANAX) tablet 0.25 mg (has no administration in time range)  busPIRone (BUSPAR) tablet 15 mg (15 mg Oral Not Given 12/26/17 1430)  clonazePAM (KLONOPIN) tablet 0.5 mg (0.5 mg Oral Not Given 12/26/17 1430)  fluvoxaMINE (LUVOX) tablet 25 mg (has no administration in time range)  LORazepam (ATIVAN)  tablet 1 mg (1 mg Oral Not Given 12/26/17 1430)  metoprolol succinate (TOPROL-XL) 24 hr tablet 25 mg (25 mg Oral Not Given 12/26/17 1430)  famotidine (PEPCID) tablet 20 mg (20 mg Oral Not Given 12/26/17 1430)  valACYclovir (VALTREX) tablet 1,000 mg (has no administration in time range)  0.9 %  sodium chloride infusion ( Intravenous Transfusing/Transfer 12/26/17 1518)  HYDROmorphone (DILAUDID) injection 0.5 mg (0.5 mg Intravenous Given 12/26/17 1433)     Initial Impression / Assessment and Plan / ED Course  I have reviewed the triage vital signs and the nursing notes.  Pertinent labs & imaging results that were available during my care of the patient were reviewed by me and considered in my medical decision making (see chart for details).  Labs: BC, CMP, lipase  Imaging:  Consults:  Therapeutics: Ceftriaxone, metronidazole  Discharge Meds:   Assessment/Plan: 32 year old male presents today with acute appendicitis.  He has no comp gating features at this time.  Antibiotics started, general surgery consulted who would be taking patient onto their service.   Final Clinical Impressions(s) / ED Diagnoses   Final diagnoses:  Acute appendicitis, unspecified acute appendicitis type    ED Discharge Orders    None       Eyvonne Mechanic, PA-C 12/26/17 1558    Little, Ambrose Finland, MD 12/30/17 1756

## 2017-12-26 NOTE — Op Note (Signed)
Calvin Baxter [id number]   PRE-OPERATIVE DIAGNOSIS:  Acute Appendicitis  POST-OPERATIVE DIAGNOSIS:  Acute Appendicitis  Procedure(s): APPENDECTOMY LAPAROSCOPIC  SURGEON:  Stephanie Coup. Jamarr Treinen, M.D.  ASSISTANT: None  ANESTHESIA: General endotracheal  EBL:   5cc  DRAINS: None  SPECIMEN:  Appendix  COUNTS:  Sponge, needle and instrument counts were reported correct x2 at conclusion of the operation  DISPOSITION:  PACU in satisfactory condition  COMPLICATIONS: None  FINDINGS: Acute nonperforated retrocecal appendicitis  INDICATIONS:  32yoM with hx of T1DM presented to ED today with 1 day history of right lower quadrant pain.  He never had this before.  He had no nausea vomiting but did have decreased appetite.  He denied fever chills.  On exam he is tender in the right lower quadrant.  His Celsa Nordahl blood cell count was 16.  He underwent CT scan which demonstrated acute appendicitis with a 13 mm appendix and appendicolith without evidence of perforation or abscess.  DESCRIPTION:  The patient was identified & brought into the operating room. SCDs were in place and functioning. General endotracheal anesthesia was administered. Preoperative antibiotics had been administered. The patient was positioned supine with left arm tucked. Pressure points were padded and checked.  A foley catheter was inserted under sterile conditions. Hair on the abdomen was clipped. The abdomen was prepped and draped in the standard sterile fashion. A surgical timeout confirmed our plan.  A small incision was made in the infraumbilical position. The subcutaneous tissue was dissected and the umbilical stalk identified. The stalk was grasped with a Kocher and retracted outwardly. The infraumbilical fascia was exposed and incised. Peritoneal entry was carefully made bluntly. An 0 Vicryl purse-string suture was placed and then the [facility] port was introduced into the abdomen.  CO2 insufflation commenced to . The  laparoscope was inserted and confirmed no evidence of injury. The patient was then positioned in Trendelenburg. Two additional ports were placed - one in left lower quadrant and another in the suprapubic midline. The patient was then placed in the left side down position.  The terminal ileum was gently mobilized to the proximal ascending colon in a lateral to medial fashion. The appendix was retrocecal. Care was taken to avoid injuring any retroperitoneal structures. The attachments to the appendix from the ascending colon and cecal mesentery were freed.  The appendix was ultimately elevated.  The base of the appendix was circumferentially dissected. The base was noted to be viable and healthy appearing. The intervening mesoappendix was ligated with a harmonic scalpel. The appendix was then stapled with an EndoGIA blue load, taking a small healthy cuff of viable cecum, taking care not to narrow the ileocecal valve. The appendix was placed in an EndoBag and extracted through the umbilical port site.  The right lower quadrant was irrigated. Hemostasis was noted to be achieved - taking time to inspect the ligated mesoappendix, colon mesentery, and retroperitoneum. Staple line was noted to be intact on the cecum with no bleeding. There was no perforation or injury. The right lower quadrant appeared clean and as such, no drain was placed. Counts were reported correct x2.  The left lower quadrant and suprapubic ports were removed under direct visualization. The CO2 was exhausted from the abdomen. The umbilical fascia was then closed using 0 Vicryl suture. The closure site was inspected and palpated and noted to be completely closed. The skin of all port sites was then approximated using 4-0 Monocryl suture. The incisions were dressed with Dermabond.  The patient was then extubated  and transferred to a stretcher for transport to recover in satisfactory condition.   Note: This dictation was prepared with  Dragon/digital dictation along with [employer] technology. Any transcriptional errors that result from this process are unintentional.

## 2017-12-27 ENCOUNTER — Encounter (HOSPITAL_COMMUNITY): Payer: Self-pay | Admitting: Surgery

## 2017-12-27 LAB — CBC
HEMATOCRIT: 36.9 % — AB (ref 39.0–52.0)
Hemoglobin: 12.7 g/dL — ABNORMAL LOW (ref 13.0–17.0)
MCH: 32.2 pg (ref 26.0–34.0)
MCHC: 34.4 g/dL (ref 30.0–36.0)
MCV: 93.4 fL (ref 78.0–100.0)
Platelets: 223 10*3/uL (ref 150–400)
RBC: 3.95 MIL/uL — ABNORMAL LOW (ref 4.22–5.81)
RDW: 12.3 % (ref 11.5–15.5)
WBC: 15.3 10*3/uL — AB (ref 4.0–10.5)

## 2017-12-27 LAB — GLUCOSE, CAPILLARY
Glucose-Capillary: 251 mg/dL — ABNORMAL HIGH (ref 70–99)
Glucose-Capillary: 192 mg/dL — ABNORMAL HIGH (ref 70–99)

## 2017-12-27 LAB — HIV ANTIBODY (ROUTINE TESTING W REFLEX): HIV SCREEN 4TH GENERATION: NONREACTIVE

## 2017-12-27 MED ORDER — MUPIROCIN 2 % EX OINT: TOPICAL_OINTMENT | Freq: Two times a day (BID) | CUTANEOUS | 0 refills | Status: DC

## 2017-12-27 MED ORDER — OXYCODONE HCL 5 MG PO TABS: 5.0000 mg | ORAL_TABLET | Freq: Four times a day (QID) | ORAL | 0 refills | Status: DC | PRN

## 2017-12-27 NOTE — Progress Notes (Signed)
Discharge home. Home discharge instruction given, no question verbalized. 

## 2017-12-27 NOTE — Progress Notes (Signed)
Inpatient Diabetes Program Recommendations  AACE/ADA: New Consensus Statement on Inpatient Glycemic Control (2015)  Target Ranges:  Prepandial:   less than 140 mg/dL      Peak postprandial:   less than 180 mg/dL (1-2 hours)      Critically ill patients:  140 - 180 mg/dL   Lab Results  Component Value Date   GLUCAP 192 (H) 12/27/2017    Review of Glycemic Control Results for Calvin DanJANES, Calvin Baxter (MRN [id number]) as of 12/27/2017 10:57  Ref. Range 12/26/2017 19:43 12/26/2017 21:17 12/27/2017 02:23 12/27/2017 07:51  Glucose-Capillary Latest Ref Range: 70 - 99 mg/dL 130155 (H) 865245 (H) 784251 (H) 192 (H)   Diabetes history: DM 1, since the age of 3 Outpatient Diabetes medications: Insulin pump Medtronic, Humalog insulin Current orders for Inpatient glycemic control: Insulin Pump Order set  Inpatient Diabetes Program Recommendations:    Spoke with patient prior to discharge regarding outpatient diabetes management. 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   Reviewed patient's current A1c of 10%. Explained what a A1c is and what it measures. Also reviewed goal A1c with patient, importance of good glucose control @ home, and blood sugar goals. Reviewed long term co-morbidies of poor glucose control to include stroke, MI, amputations, and neuropathy.   Discussed the need for improvement to BS using the Freestyle as a tool to help guide adjustments while at work. Patient begins having hypoglycemia awareness starting at 100's, indicating poor control (but patient does not have a meter at work?). Patient adjusts settings to run high during the day to avoid these feelings. Educated patient on adjusting setting on insulin pump could be contributing to increased A1C and diligent work needs to be done to titrate rates appropriately to gain better control. Informed patient that current inpatient insulin needs were high and that current BS was 271 mg/dL, indicating  poor control and use of insulin pump. With multiple variables that include: setting of s/p appendectomy, recent addition of multiple anti-anxiety medications, and patient frequently changing settings on insulin pump, it creates difficulty in managing BS.   As patient is awaiting discharge today, encouraged follow up with Dr Jacky KindleAronson and informed patient of local endocrinologists for additional counsel. Briefly discussed Freestyle Libre and patient aware of how to place and feels comfortable. Encouraged to bring reader with him to next appointment and schedule as soon as he is able. At this time patient has no further questions regarding diabetes.   Thanks, Lujean RaveLauren Shenika Quint, MSN, RNC-OB Diabetes Coordinator [phone] (8a-5p)

## 2017-12-27 NOTE — Discharge Summary (Signed)
[facility] Discharge Summary   Patient ID: Calvin DanCameron Gianino MRN: [id number] DOB/AGE: 33-Nov-1987 33 y.o.  Admit date: 12/26/2017 Discharge date: 12/27/2017  Admitting Diagnosis: Acute appendicitis  Discharge Diagnosis Patient Active Problem List   Diagnosis Date Noted  . Acute appendicitis, uncomplicated 12/26/2017  . S/P laparoscopic appendectomy 12/26/2017  . Nausea without vomiting 06/28/2017  . Alcoholism (HCC) 06/28/2017  . Type I diabetes mellitus with complication, uncontrolled (HCC) 06/28/2017  . Dizziness 03/13/2014    Consultants None  Imaging: Ct Abdomen Pelvis W Contrast  Result Date: 12/26/2017 CLINICAL DATA:  istat b/c diabetic, creatinine 1.2 bun 27 gfr 79 100mL isovue 300 STAT Call [phone] NP rlq pain sharp since last night no surgeries leukocytosis no history cancer EXAM: CT ABDOMEN AND PELVIS WITH CONTRAST TECHNIQUE: Multidetector CT imaging of the abdomen and pelvis was performed using the standard protocol following bolus administration of intravenous contrast. CONTRAST:  100mL ISOVUE-300 IOPAMIDOL (ISOVUE-300) INJECTION 61% Creatinine [facility] at [address] Results: Creatinine 1.2 mg/dL.  BUN 27.  GFR 79 COMPARISON:  None. FINDINGS: Lower chest: No acute abnormality. Hepatobiliary: No focal liver abnormality is seen. No gallstones, gallbladder wall thickening, or biliary dilatation. Pancreas: Unremarkable. No pancreatic ductal dilatation or surrounding inflammatory changes. Spleen: Normal in size without focal abnormality. Adrenals/Urinary Tract: Adrenal glands are unremarkable. Kidneys are normal, without renal calculi, focal lesion, or hydronephrosis. Bladder is unremarkable. Stomach/Bowel: Stomach is partially distended. Small bowel nondilated. Appendix: Location: Retrocecal Diameter: Up to 1.3 cm Appendicolith: Present Mucosal hyper-enhancement: Patchy Extraluminal gas: None Periappendiceal collection: No abscess.  Regional inflammatory/edematous change. Moderate colonic fecal material without dilatation. Vascular/Lymphatic: No significant vascular findings are present. No enlarged abdominal or pelvic lymph nodes. Reproductive: Prostate is unremarkable. Other: No ascites.  No free air. Musculoskeletal: No acute or significant osseous findings. IMPRESSION: 1. Acute appendicitis without CT evidence of perforation or abscess. Critical Value/emergent results were called by telephone at the time of interpretation on 12/26/2017 at 12:32 pm to [facility] RN for Dr. Geoffry ParadiseICHARD ARONSON , who verbally acknowledged these results. Electronically Signed   By: Corlis Leak  Hassell M.D.   On: 12/26/2017 12:35    Procedures Dr. Cliffton AstersWhite (12/26/17) - Laparoscopic Appendectomy  Hospital Course:  Calvin Baxter is a 32yo male PMH type 1 DM who [facility] with acute onset abdominal pain that localized to his RLQ.  Workup showed acute appendicitis.  Patient was admitted and underwent procedure listed above.  Tolerated procedure well and was transferred to the floor.  Diet was advanced as tolerated.  On POD1, the patient was voiding well, tolerating diet, ambulating well, pain well controlled, vital signs stable, incisions c/d/i and felt stable for discharge home.  Patient will follow up as below and knows to call with questions or concerns.     Physical Exam: General:  Alert, NAD, pleasant, comfortable Pulm: effort normal, CTAB Cardio: RRR Abd:  Soft, ND, appropriately tender, multiple lap incisions C/D/I, +BS  Allergies as of 12/27/2017   No Known Allergies     Medication List    TAKE these medications   busPIRone 30 MG tablet Commonly known as:  BUSPAR Take 15 mg by mouth 2 (two) times daily.   clonazePAM 0.5 MG tablet Commonly known as:  KLONOPIN Take 0.5 mg by mouth 2 (two) times daily.   fexofenadine 180 MG tablet Commonly known as:  ALLEGRA Take 180 mg by mouth daily.   fluvoxaMINE 25 MG tablet Commonly known as:   LUVOX  Take 50 mg by mouth at bedtime.   gabapentin 300 MG capsule Commonly known as:  NEURONTIN Take 300-900 mg by mouth See admin instructions. Take 300 mg in the morning and 900 mg at night   GINGER PO Take 1 tablet by mouth daily.   GLUCAGON EMERGENCY 1 MG injection Generic drug:  glucagon Inject 1 mg into the vein once as needed (in emergency situation).   insulin aspart protamine- aspart (70-30) 100 UNIT/ML injection Commonly known as:  NOVOLOG MIX 70/30 Inject into the skin continuous. Use as directed in insulin pump, max 100 units/day   Insulin Disposable Pump Misc by Does not apply route.   mupirocin ointment 2 % Commonly known as:  BACTROBAN Place into the nose 2 (two) times daily.   NONFORMULARY OR COMPOUNDED ITEM Apply 1-2 g topically 4 (four) times daily. Peripheral Neuropathy cream: Bupivacaine 1% Doxepin 3% Gabapentin 6% Ibuprofen 3% Pentoxifylline 3%   ondansetron 8 MG disintegrating tablet Commonly known as:  ZOFRAN ODT Take 1 tablet (8 mg total) by mouth every 8 (eight) hours as needed for nausea or vomiting.   oxyCODONE 5 MG immediate release tablet Commonly known as:  Oxy IR/ROXICODONE Take 1 tablet (5 mg total) by mouth every 6 (six) hours as needed for severe pain.   propranolol 10 MG tablet Commonly known as:  INDERAL Take 5 mg by mouth 2 (two) times daily.   rosuvastatin 20 MG tablet Commonly known as:  CRESTOR Take 20 mg by mouth once a week.   sildenafil 20 MG tablet Commonly known as:  REVATIO take 5 tablets by mouth 1 hour prior to sexual activity as needed   VALTREX 1000 MG tablet Generic drug:  valACYclovir Take 1,000 mg by mouth 3 (three) times daily.        Follow-up Information    [facility], [place]. Go on 01/10/2018.   Specialty:  General Surgery Why:  Your appointment is 07/18 at 3 pm Please arrive 30 minutes prior to your appointment to check in and fill out paperwork. Bring photo ID and insurance  information. Contact information: [address] [place] [address] [phone]          Signed: Franne Forts, [facility] 12/27/2017, 7:34 AM Pager: [phone] Consults: [phone] Mon 7:00 am -11:30 AM Tues-Fri 7:00 am-4:30 pm Sat-Sun 7:00 am-11:30 am

## 2018-01-01 DIAGNOSIS — F410 Panic disorder [episodic paroxysmal anxiety] without agoraphobia: Secondary | ICD-10-CM | POA: Diagnosis not present

## 2018-01-01 DIAGNOSIS — F411 Generalized anxiety disorder: Secondary | ICD-10-CM | POA: Diagnosis not present

## 2018-01-03 ENCOUNTER — Telehealth: Payer: Self-pay | Admitting: *Deleted

## 2018-01-23 ENCOUNTER — Ambulatory Visit
Admission: RE | Admit: 2018-01-23 | Discharge: 2018-01-23 | Disposition: A | Discharge status: Home / Self Care | Payer: BLUE CROSS/BLUE SHIELD | Source: Ambulatory Visit | Attending: Registered Nurse | Admitting: Registered Nurse

## 2018-01-23 ENCOUNTER — Other Ambulatory Visit: Payer: Self-pay | Admitting: Registered Nurse

## 2018-01-23 DIAGNOSIS — Z6825 Body mass index (BMI) 25.0-25.9, adult: Secondary | ICD-10-CM | POA: Diagnosis not present

## 2018-01-23 DIAGNOSIS — Z9049 Acquired absence of other specified parts of digestive tract: Secondary | ICD-10-CM

## 2018-01-23 DIAGNOSIS — R1030 Lower abdominal pain, unspecified: Secondary | ICD-10-CM

## 2018-01-23 MED ORDER — IOPAMIDOL (ISOVUE-300) INJECTION 61%
100.0000 mL | Freq: Once | INTRAVENOUS | Status: AC | PRN
  Administered 2018-01-23: 100 mL via INTRAVENOUS

## 2018-01-24 ENCOUNTER — Telehealth: Payer: Self-pay

## 2018-02-11 NOTE — Progress Notes (Deleted)
NEUROLOGY CONSULTATION NOTE  Calvin Baxter MRN: [id number] DOB: May 31, 1986  Referring provider: Toni Arthurs, MD Primary care provider: Geoffry Paradise, MD  Reason for consult:  neuropathy  HISTORY OF PRESENT ILLNESS: Calvin Baxter is a 32 year old ***-handed male with type 1 diabetes mellitus, anxiety and history of alcoholism who presents for neuropathy.  History supplemented by referring provider's note.  Since his mid-20s, he has had burning sensations in both lateral aspect of his feet and over his great toe.  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  Pain is aggravated when he lays down.  Pain is relieved when he is standing or walking oh his feet.  He denies low back pain or radicular pain down the legs.  He denies weakness.    PAST MEDICAL HISTORY: Past Medical History:  Diagnosis Date  . Alcoholism (HCC)   . Anxiety, generalized    on lexapro  . Childhood asthma   . Dizziness   . GERD (gastroesophageal reflux disease)   . Neuropathy    diagnosed by Dr Ralene Cork per patient  . Type I diabetes mellitus (HCC)     PAST SURGICAL HISTORY: Past Surgical History:  Procedure Laterality Date  . LAPAROSCOPIC APPENDECTOMY N/A 12/26/2017   Procedure: APPENDECTOMY LAPAROSCOPIC;  Surgeon: Andria Meuse, MD;  Location: [facility] OR;  Service: General;  Laterality: N/A;  . NO PAST SURGERIES    . None      MEDICATIONS: Current Outpatient Medications on File Prior to Visit  Medication Sig Dispense Refill  . busPIRone (BUSPAR) 30 MG tablet Take 15 mg by mouth 2 (two) times daily.   0  . clonazePAM (KLONOPIN) 0.5 MG tablet Take 0.5 mg by mouth 2 (two) times daily.   0  . fexofenadine (ALLEGRA) 180 MG tablet Take 180 mg by mouth daily.    . fluvoxaMINE (LUVOX) 25 MG tablet Take 50 mg by mouth at bedtime.   0  . gabapentin (NEURONTIN) 300 MG capsule Take 300-900 mg by mouth See admin instructions. Take 300 mg in the morning and 900 mg at night  0  . Ginger, Zingiber  officinalis, (GINGER PO) Take 1 tablet by mouth daily.    Marland Kitchen glucagon (GLUCAGON EMERGENCY) 1 MG injection Inject 1 mg into the vein once as needed (in emergency situation).    . insulin aspart protamine- aspart (NOVOLOG MIX 70/30) (70-30) 100 UNIT/ML injection Inject into the skin continuous. Use as directed in insulin pump, max 100 units/day     . Insulin Disposable Pump MISC by Does not apply route.    . mupirocin ointment (BACTROBAN) 2 % Place into the nose 2 (two) times daily. 22 g 0  . NONFORMULARY OR COMPOUNDED ITEM Apply 1-2 g topically 4 (four) times daily. Peripheral Neuropathy cream: Bupivacaine 1% Doxepin 3% Gabapentin 6% Ibuprofen 3% Pentoxifylline 3% 120 each 2  . ondansetron (ZOFRAN ODT) 8 MG disintegrating tablet Take 1 tablet (8 mg total) by mouth every 8 (eight) hours as needed for nausea or vomiting. 30 tablet 3  . oxyCODONE (OXY IR/ROXICODONE) 5 MG immediate release tablet Take 1 tablet (5 mg total) by mouth every 6 (six) hours as needed for severe pain. 20 tablet 0  . propranolol (INDERAL) 10 MG tablet Take 5 mg by mouth 2 (two) times daily.  1  . rosuvastatin (CRESTOR) 20 MG tablet Take 20 mg by mouth once a week.    . sildenafil (REVATIO) 20 MG tablet take 5 tablets by mouth  1 hour prior to sexual activity as needed  3  . valACYclovir (VALTREX) 1000 MG tablet Take 1,000 mg by mouth 3 (three) times daily.     No current facility-administered medications on file prior to visit.     ALLERGIES: No Known Allergies  FAMILY HISTORY: Family History  Problem Relation Age of Onset  . Breast cancer Maternal Grandmother   . Colon cancer Neg Hx   . Liver cancer Neg Hx   . Rectal cancer Neg Hx   . Stomach cancer Neg Hx   . Esophageal cancer Neg Hx     SOCIAL HISTORY: Social History   Socioeconomic History  . Marital status: Married    Spouse name: Not on file  . Number of children: 0  . Years of education: Not on file  . Highest education level: Not on file  Occupational  History  . Occupation: professional caddy  Social Needs  . Financial resource strain: Not on file  . Food insecurity:    Worry: Not on file    Inability: Not on file  . Transportation needs:    Medical: Not on file    Non-medical: Not on file  Tobacco Use  . Smoking status: Former Smoker    Packs/day: 1.00    Years: 2.00    Pack years: 2.00    Types: Cigarettes    Last attempt to quit: 2008    Years since quitting: 11.6  . Smokeless tobacco: Former NeurosurgeonUser    Types: Chew    Quit date: 07/28/2015  Substance and Sexual Activity  . Alcohol use: Not Currently    Frequency: Never    Comment: 12/26/2017 "stopped Thanksgiving day 2018; history of alcoholism"  . Drug use: Never  . Sexual activity: Yes  Lifestyle  . Physical activity:    Days per week: Not on file    Minutes per session: Not on file  . Stress: Not on file  Relationships  . Social connections:    Talks on phone: Not on file    Gets together: Not on file    Attends religious service: Not on file    Active member of club or organization: Not on file    Attends meetings of clubs or organizations: Not on file    Relationship status: Not on file  . Intimate partner violence:    Fear of current or ex partner: Not on file    Emotionally abused: Not on file    Physically abused: Not on file    Forced sexual activity: Not on file  Other Topics Concern  . Not on file  Social History Narrative  . Not on file    REVIEW OF SYSTEMS: Constitutional: No fevers, chills, or sweats, no generalized fatigue, change in appetite Eyes: No visual changes, double vision, eye pain Ear, nose and throat: No hearing loss, ear pain, nasal congestion, sore throat Cardiovascular: No chest pain, palpitations Respiratory:  No shortness of breath at rest or with exertion, wheezes GastrointestinaI: No nausea, vomiting, diarrhea, abdominal pain, fecal incontinence Genitourinary:  No dysuria, urinary retention or frequency Musculoskeletal:  No  neck pain, back pain Integumentary: No rash, pruritus, skin lesions Neurological: as above Psychiatric: No depression, insomnia, anxiety Endocrine: No palpitations, fatigue, diaphoresis, mood swings, change in appetite, change in weight, increased thirst Hematologic/Lymphatic:  No purpura, petechiae. Allergic/Immunologic: no itchy/runny eyes, nasal congestion, recent allergic reactions, rashes  PHYSICAL EXAM: *** General: No acute distress.  Patient appears ***-groomed.  *** Head:  Normocephalic/atraumatic Eyes:  fundi examined but not visualized Neck: supple, no paraspinal tenderness, full range of motion Back: No paraspinal tenderness Heart: regular rate and rhythm Lungs: Clear to auscultation bilaterally. Vascular: No carotid bruits. Neurological Exam: Mental status: alert and oriented to person, place, and time, recent and remote memory intact, fund of knowledge intact, attention and concentration intact, speech fluent and not dysarthric, language intact. Cranial nerves: CN I: not tested CN II: pupils equal, round and reactive to light, visual fields intact CN III, IV, VI:  full range of motion, no nystagmus, no ptosis CN V: facial sensation intact CN VII: upper and lower face symmetric CN VIII: hearing intact CN IX, X: gag intact, uvula midline CN XI: sternocleidomastoid and trapezius muscles intact CN XII: tongue midline Bulk & Tone: normal, no fasciculations. Motor:  5/5 throughout *** Sensation:  Pinprick *** temperature *** and vibration sensation intact.  ***. Deep Tendon Reflexes:  2+ throughout, *** toes downgoing.  *** Finger to nose testing:  Without dysmetria.  *** Heel to shin:  Without dysmetria.  *** Gait:  Normal station and stride.  Able to turn and tandem walk. Romberg ***.  IMPRESSION: ***  PLAN: ***  Thank you for allowing me to take part in the care of this patient.  Shon MilletAdam Nial Hawe, DO  CC:  Geoffry Paradiseichard Aronson, MD  Toni ArthursJohn Hewitt, MD

## 2018-02-12 ENCOUNTER — Ambulatory Visit: Payer: BLUE CROSS/BLUE SHIELD | Admitting: Neurology

## 2018-03-29 DIAGNOSIS — F411 Generalized anxiety disorder: Secondary | ICD-10-CM | POA: Diagnosis not present

## 2018-03-29 DIAGNOSIS — F410 Panic disorder [episodic paroxysmal anxiety] without agoraphobia: Secondary | ICD-10-CM | POA: Diagnosis not present

## 2018-04-02 ENCOUNTER — Encounter: Payer: Self-pay | Admitting: Podiatry

## 2018-04-02 ENCOUNTER — Ambulatory Visit: Payer: BLUE CROSS/BLUE SHIELD | Admitting: Podiatry

## 2018-04-02 ENCOUNTER — Other Ambulatory Visit: Payer: Self-pay | Admitting: Podiatry

## 2018-04-02 ENCOUNTER — Ambulatory Visit (INDEPENDENT_AMBULATORY_CARE_PROVIDER_SITE_OTHER): Payer: BLUE CROSS/BLUE SHIELD

## 2018-04-02 DIAGNOSIS — M79672 Pain in left foot: Secondary | ICD-10-CM | POA: Diagnosis not present

## 2018-04-02 DIAGNOSIS — M79671 Pain in right foot: Secondary | ICD-10-CM

## 2018-04-02 DIAGNOSIS — Q667 Congenital pes cavus, unspecified foot: Secondary | ICD-10-CM | POA: Diagnosis not present

## 2018-04-02 DIAGNOSIS — M779 Enthesopathy, unspecified: Secondary | ICD-10-CM

## 2018-04-02 DIAGNOSIS — M7672 Peroneal tendinitis, left leg: Secondary | ICD-10-CM | POA: Diagnosis not present

## 2018-04-02 DIAGNOSIS — E1040 Type 1 diabetes mellitus with diabetic neuropathy, unspecified: Secondary | ICD-10-CM

## 2018-04-02 DIAGNOSIS — E113413 Type 2 diabetes mellitus with severe nonproliferative diabetic retinopathy with macular edema, bilateral: Secondary | ICD-10-CM | POA: Diagnosis not present

## 2018-04-02 NOTE — Progress Notes (Addendum)
ubjective: 32 year old male presents the office today for concerns of bilateral foot pain the left side worse than the right.  He is previously seen 3 other physicians in our group for this.  Has been told he has neuropathy.  He states that he also gets pain to the outside of his feet mostly gentle walking and standing.  He points more towards the fifth metatarsal base.  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  He has been on gabapentin for both anxiety as well as neuropathy and this has not changed his foot pain significantly.  He denies any recent injury or trauma.  This been a chronic issue.  His last A1c was 9.2.  Denies any systemic complaints such as fevers, chills, nausea, vomiting. No acute changes since last appointment, and no other complaints at this time.   Objective: AAO x3, NAD DP/PT pulses palpable bilaterally, CRT less than 3 seconds Overall sensation appears to be mostly intact with Dorann Ou monofilament.  However upon weightbearing evaluation there is a cavus foot type and he appears to bSe walking more to the lateral aspect of his foot.  There is tenderness along the distal portion of the peroneal tendon and more along insertion into the fifth metatarsal base.  Overall the peroneal tendon appears to be intact.  There is no edema, erythema.no open lesions No open lesions or pre-ulcerative lesions.  No pain with calf compression, swelling, warmth, erythema  Assessment: Bilateral foot pain likely peroneal tendinitis given cavus foot type, previously been diagnosed with neuropathy  Plan: -All treatment options discussed with the patient including all alternatives, risks, complications.  -We discussed a steroid injection on the left foot.  There is good results with this identical to the right side.  I also think that he needs some custom orthotics given his foot type and amount of walking he is doing.  Raiford Noble evaluate him  today he was molded with the inserts.  Plantar fascial brace was applied from medial to lateral to support the peroneal tendon. -Patient encouraged to call the office with any questions, concerns, change in symptoms.   Procedure: Injection Tendon/Ligament Discussed alternatives, risks, complications and verbal consent was obtained.  Location: Left lateral foot; 5th metatarsal base Skin Prep: Alcohol. Injectate: 0.5cc 0.5% marcaine plain, 0.5 cc 2% dexamethasone phosphate Disposition: Patient tolerated procedure well. Injection site dressed with a band-aid.  Post-injection care was discussed and return precautions discussed.   Vivi Barrack DPM

## 2018-04-10 DIAGNOSIS — Z794 Long term (current) use of insulin: Secondary | ICD-10-CM | POA: Insufficient documentation

## 2018-04-10 DIAGNOSIS — Z4681 Encounter for fitting and adjustment of insulin pump: Secondary | ICD-10-CM | POA: Diagnosis not present

## 2018-04-10 DIAGNOSIS — E108 Type 1 diabetes mellitus with unspecified complications: Secondary | ICD-10-CM | POA: Diagnosis not present

## 2018-04-10 DIAGNOSIS — Z6826 Body mass index (BMI) 26.0-26.9, adult: Secondary | ICD-10-CM | POA: Diagnosis not present

## 2018-04-17 DIAGNOSIS — E103511 Type 1 diabetes mellitus with proliferative diabetic retinopathy with macular edema, right eye: Secondary | ICD-10-CM | POA: Diagnosis not present

## 2018-04-17 DIAGNOSIS — H3582 Retinal ischemia: Secondary | ICD-10-CM | POA: Diagnosis not present

## 2018-04-17 DIAGNOSIS — H3563 Retinal hemorrhage, bilateral: Secondary | ICD-10-CM | POA: Diagnosis not present

## 2018-04-17 DIAGNOSIS — E103512 Type 1 diabetes mellitus with proliferative diabetic retinopathy with macular edema, left eye: Secondary | ICD-10-CM | POA: Diagnosis not present

## 2018-04-23 ENCOUNTER — Ambulatory Visit: Payer: BLUE CROSS/BLUE SHIELD | Admitting: Podiatry

## 2018-04-23 ENCOUNTER — Ambulatory Visit: Payer: BLUE CROSS/BLUE SHIELD | Admitting: Orthotics

## 2018-04-23 DIAGNOSIS — E1142 Type 2 diabetes mellitus with diabetic polyneuropathy: Secondary | ICD-10-CM

## 2018-04-23 DIAGNOSIS — M7671 Peroneal tendinitis, right leg: Secondary | ICD-10-CM

## 2018-04-23 DIAGNOSIS — M7670 Peroneal tendinitis, unspecified leg: Secondary | ICD-10-CM

## 2018-04-23 DIAGNOSIS — Q667 Congenital pes cavus, unspecified foot: Secondary | ICD-10-CM

## 2018-04-23 DIAGNOSIS — M7672 Peroneal tendinitis, left leg: Secondary | ICD-10-CM

## 2018-04-23 NOTE — Progress Notes (Signed)
Subjective: 32 year old male presents the office today for follow-up evaluation of bilateral foot pain as well as to pick up orthotics with Raiford Noble.  He states the injection helped significantly on the left foot he is asking for an injection to the same area on the right foot.  He states that he has pain after doing a lot of walking during the day.  This is been ongoing issue for him. Denies any systemic complaints such as fevers, chills, nausea, vomiting. No acute changes since last appointment, and no other complaints at this time.   Objective: AAO x3, NAD DP/PT pulses palpable bilaterally, CRT less than 3 seconds Tenderness palpation to the right fifth metatarsal base.  The pain on left side is much improved.  Overall the peroneal tendons appear to be intact.  Cavus foot type is present.  No area pinpoint bony tenderness pain to vibratory sensation.  No overlying edema, erythema. No open lesions or pre-ulcerative lesions.  No pain with calf compression, swelling, warmth, erythema  Assessment: Right foot fifth metatarsal base pain, insertional peroneal tendinitis  Plan: -All treatment options discussed with the patient including all alternatives, risks, complications.  -He is doing very well in the left foot the injection helped quite a bit without any complications.  Injection was performed on the right foot as well today.  See procedure note below.  Ice the area as well as postinjection instructions discussed.  Orthotics were dispensed by Raiford Noble today. -Patient encouraged to call the office with any questions, concerns, change in symptoms.   Procedure: Injection Tendon/Ligament Discussed alternatives, risks, complications and verbal consent was obtained.  Location: Right fifth metatarsal base Skin Prep: Alcohol. Injectate: 0.5cc 0.5% marcaine plain, 0.5 cc dexamethasone phosphate Injection was not done directly into the tendon but more on the 5th metatarsal base.  Disposition: Patient  tolerated procedure well. Injection site dressed with a band-aid.  Post-injection care was discussed and return precautions discussed.   Vivi Barrack DPM

## 2018-04-23 NOTE — Progress Notes (Signed)
Patient came in today to pick up custom made foot orthotics.  The goals were accomplished and the patient reported no dissatisfaction with said orthotics.  Patient was advised of breakin period and how to report any issues. 

## 2018-04-29 ENCOUNTER — Telehealth: Payer: Self-pay | Admitting: Podiatry

## 2018-04-29 NOTE — Telephone Encounter (Signed)
I called pt, pt states his foot swelled more last night than it had been and he was wanted to be seen to be evaluated. I transferred pt to scheduler and told him he could have the note written at that time.

## 2018-04-29 NOTE — Telephone Encounter (Signed)
Pt is needing a note for work. He was in to be seen last week. Still having pain

## 2018-04-30 ENCOUNTER — Encounter: Payer: Self-pay | Admitting: Podiatry

## 2018-04-30 ENCOUNTER — Ambulatory Visit (INDEPENDENT_AMBULATORY_CARE_PROVIDER_SITE_OTHER): Payer: BLUE CROSS/BLUE SHIELD

## 2018-04-30 ENCOUNTER — Ambulatory Visit: Payer: BLUE CROSS/BLUE SHIELD | Admitting: Podiatry

## 2018-04-30 DIAGNOSIS — Z79899 Other long term (current) drug therapy: Secondary | ICD-10-CM | POA: Diagnosis not present

## 2018-04-30 DIAGNOSIS — B351 Tinea unguium: Secondary | ICD-10-CM

## 2018-04-30 DIAGNOSIS — S92301A Fracture of unspecified metatarsal bone(s), right foot, initial encounter for closed fracture: Secondary | ICD-10-CM

## 2018-04-30 NOTE — Patient Instructions (Signed)

## 2018-05-01 DIAGNOSIS — E103511 Type 1 diabetes mellitus with proliferative diabetic retinopathy with macular edema, right eye: Secondary | ICD-10-CM | POA: Diagnosis not present

## 2018-05-01 LAB — CBC WITH DIFFERENTIAL/PLATELET
BASOS ABS: 40 {cells}/uL (ref 0–200)
Basophils Relative: 0.5 %
EOS ABS: 166 {cells}/uL (ref 15–500)
Eosinophils Relative: 2.1 %
HCT: 43.7 % (ref 38.5–50.0)
HEMOGLOBIN: 15 g/dL (ref 13.2–17.1)
Lymphs Abs: 1770 cells/uL (ref 850–3900)
MCH: 32.5 pg (ref 27.0–33.0)
MCHC: 34.3 g/dL (ref 32.0–36.0)
MCV: 94.8 fL (ref 80.0–100.0)
MONOS PCT: 5.8 %
MPV: 11 fL (ref 7.5–12.5)
NEUTROS PCT: 69.2 %
Neutro Abs: 5467 cells/uL (ref 1500–7800)
Platelets: 251 10*3/uL (ref 140–400)
RBC: 4.61 10*6/uL (ref 4.20–5.80)
RDW: 12.3 % (ref 11.0–15.0)
TOTAL LYMPHOCYTE: 22.4 %
WBC mixed population: 458 cells/uL (ref 200–950)
WBC: 7.9 10*3/uL (ref 3.8–10.8)

## 2018-05-01 LAB — HEPATIC FUNCTION PANEL
AG Ratio: 1.7 (calc) (ref 1.0–2.5)
ALBUMIN MSPROF: 4.3 g/dL (ref 3.6–5.1)
ALT: 32 U/L (ref 9–46)
AST: 27 U/L (ref 10–40)
Alkaline phosphatase (APISO): 94 U/L (ref 40–115)
BILIRUBIN DIRECT: 0.2 mg/dL (ref 0.0–0.2)
Globulin: 2.5 g/dL (calc) (ref 1.9–3.7)
Indirect Bilirubin: 0.6 mg/dL (calc) (ref 0.2–1.2)
Total Bilirubin: 0.8 mg/dL (ref 0.2–1.2)
Total Protein: 6.8 g/dL (ref 6.1–8.1)

## 2018-05-01 NOTE — Progress Notes (Signed)
Subjective: 32 year old male presents the office today for new concern.  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  Over the weekend he states that he started to notice increasing pain and swelling to the right foot.  He does not recall any specific injury.  He does have a history of second metatarsal fracture.  He is also concerned about toenail fungus.  Tried over-the-counter medicine has been getting worse.  No pain in the nails.  Denies any systemic complaints such as fevers, chills, nausea, vomiting. No acute changes since last appointment, and no other complaints at this time.   Objective: AAO x3, NAD DP/PT pulses palpable bilaterally, CRT less than 3 seconds There is edema and mild erythema to the dorsal aspect of the right foot on the second metatarsal area and there is tenderness tracking on the bone to this area and mildly to the third metatarsal diaphysis.  There is no pain with ankle or other areas of the foot.  Peroneal tendon intact and there is no pain on the fifth metatarsal base today. No open lesions or pre-ulcerative lesions.  Nails are hypertrophic, dystrophic with yellow to brown discoloration.  No pain in the nails no surrounding redness or drainage. No pain with calf compression, swelling, warmth, erythema  Assessment: Concern for stress fracture right foot; onychomycosis  Plan: -All treatment options discussed with the patient including all alternatives, risks, complications.  -Repeat x-rays were obtained which reveals an old fracture of the second metatarsal but no definitive evidence of acute fracture or stress fracture identified today.  However given his pain and history I do want to immobilize in a cam boot which she Artie has at home.  Also gave him a graphite insert that he can try to wear inside of his golf shoes as it starts to improve.  However for now and wear the cam boot  for swelling and pain have resolved.  I will see him back in 2 weeks for repeat x-rays or sooner if needed.  I want to hold off on working as well until this resolves. -Impression nail fungus we discussed multiple treatment options.  After discussion and risks he was to do oral medication.  We will check a CBC and LFT.  Once this comes back normal I will order the medication for him we will recheck it in 6 weeks. -Patient encouraged to call the office with any questions, concerns, change in symptoms.   Vivi Barrack DPM

## 2018-05-02 ENCOUNTER — Telehealth: Payer: Self-pay | Admitting: *Deleted

## 2018-05-02 NOTE — Telephone Encounter (Signed)
-----   Message from Vivi Barrack, DPM sent at 05/01/2018  5:51 PM EST ----- Val- please let him know that he can start Lamisil. Can you please order Lamisil 250mg  daily x 90 days. Follow-up in 6 weeks for this (he has an appt in 2 weeks for stress fracture). Thanks.

## 2018-05-02 NOTE — Telephone Encounter (Signed)
Left message for pt to call back for results and orders.

## 2018-05-03 DIAGNOSIS — H1131 Conjunctival hemorrhage, right eye: Secondary | ICD-10-CM | POA: Diagnosis not present

## 2018-05-03 DIAGNOSIS — Z794 Long term (current) use of insulin: Secondary | ICD-10-CM | POA: Diagnosis not present

## 2018-05-03 DIAGNOSIS — H11431 Conjunctival hyperemia, right eye: Secondary | ICD-10-CM | POA: Diagnosis not present

## 2018-05-03 DIAGNOSIS — H5711 Ocular pain, right eye: Secondary | ICD-10-CM | POA: Diagnosis not present

## 2018-05-03 DIAGNOSIS — H5319 Other subjective visual disturbances: Secondary | ICD-10-CM | POA: Diagnosis not present

## 2018-05-03 DIAGNOSIS — E108 Type 1 diabetes mellitus with unspecified complications: Secondary | ICD-10-CM | POA: Diagnosis not present

## 2018-05-03 DIAGNOSIS — E103511 Type 1 diabetes mellitus with proliferative diabetic retinopathy with macular edema, right eye: Secondary | ICD-10-CM | POA: Diagnosis not present

## 2018-05-03 DIAGNOSIS — E109 Type 1 diabetes mellitus without complications: Secondary | ICD-10-CM | POA: Diagnosis not present

## 2018-05-03 DIAGNOSIS — Z9641 Presence of insulin pump (external) (internal): Secondary | ICD-10-CM | POA: Diagnosis not present

## 2018-05-03 MED ORDER — TERBINAFINE HCL 250 MG PO TABS: 250.0000 mg | ORAL_TABLET | Freq: Every day | ORAL | 0 refills | Status: DC

## 2018-05-03 NOTE — Telephone Encounter (Signed)
Pt called for results. I informed pt of Dr. Gabriel Rung orders.

## 2018-05-09 DIAGNOSIS — E109 Type 1 diabetes mellitus without complications: Secondary | ICD-10-CM | POA: Diagnosis not present

## 2018-05-09 DIAGNOSIS — Z9641 Presence of insulin pump (external) (internal): Secondary | ICD-10-CM | POA: Diagnosis not present

## 2018-05-09 DIAGNOSIS — E108 Type 1 diabetes mellitus with unspecified complications: Secondary | ICD-10-CM | POA: Diagnosis not present

## 2018-05-09 DIAGNOSIS — Z794 Long term (current) use of insulin: Secondary | ICD-10-CM | POA: Diagnosis not present

## 2018-05-14 ENCOUNTER — Ambulatory Visit (INDEPENDENT_AMBULATORY_CARE_PROVIDER_SITE_OTHER): Payer: BLUE CROSS/BLUE SHIELD

## 2018-05-14 ENCOUNTER — Encounter: Payer: Self-pay | Admitting: Podiatry

## 2018-05-14 ENCOUNTER — Ambulatory Visit: Payer: BLUE CROSS/BLUE SHIELD | Admitting: Podiatry

## 2018-05-14 ENCOUNTER — Telehealth: Payer: Self-pay | Admitting: *Deleted

## 2018-05-14 DIAGNOSIS — S92301A Fracture of unspecified metatarsal bone(s), right foot, initial encounter for closed fracture: Secondary | ICD-10-CM | POA: Diagnosis not present

## 2018-05-14 DIAGNOSIS — M84374A Stress fracture, right foot, initial encounter for fracture: Secondary | ICD-10-CM | POA: Diagnosis not present

## 2018-05-14 DIAGNOSIS — M7989 Other specified soft tissue disorders: Secondary | ICD-10-CM | POA: Diagnosis not present

## 2018-05-14 NOTE — Telephone Encounter (Signed)
[facility] - [facility] states pt is at the lab and she needs orders. Dr. Ardelle AntonWagoner order Vitamin D. I informed [facility].

## 2018-05-14 NOTE — Progress Notes (Signed)
Subjective: 32 year old male presents the office today for follow-up evaluation of right foot pain.  States the foot is still swelling and painful.  He states the graphite insert is not helped much.  He is not able to wear the offloading shoe that we gave him because of his job.  The left foot otherwise is doing well.  He has some pain to the big toe joint at times but otherwise no significant pain.  No swelling to the left foot. Denies any systemic complaints such as fevers, chills, nausea, vomiting. No acute changes since last appointment, and no other complaints at this time.   Objective: AAO x3, NAD DP/PT pulses palpable bilaterally, CRT less than 3 seconds There is tenderness continuation along the second metatarsal on the right foot.  There is edema erythema to the right foot as well.  There is no other area pinpoint bony tenderness.  On the left foot there is no area pinpoint tenderness there is no edema, erythema.  No open lesions identified. No open lesions or pre-ulcerative lesions.  No pain with calf compression, swelling, warmth, erythema  Assessment: Right second metatarsal fracture  Plan: -All treatment options discussed with the patient including all alternatives, risks, complications.  -Repeat x-rays were obtained reviewed which reveals a radiolucent line in the neck of the second metatarsal consistent with a stress fracture.  Recommend immobilization in a cam boot that he has.  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 -Ice/elevation -Limit weightbearing -Will check Vitamin D level due to fractures -Patient encouraged to call the office with any questions, concerns, change in symptoms.   Vivi BarrackMatthew R Merlyn Conley DPM

## 2018-05-15 DIAGNOSIS — E103512 Type 1 diabetes mellitus with proliferative diabetic retinopathy with macular edema, left eye: Secondary | ICD-10-CM | POA: Diagnosis not present

## 2018-05-15 LAB — COMPLETE METABOLIC PANEL WITH GFR
AG Ratio: 1.8 (calc) (ref 1.0–2.5)
ALT: 35 U/L (ref 9–46)
AST: 26 U/L (ref 10–40)
Albumin: 4.3 g/dL (ref 3.6–5.1)
Alkaline phosphatase (APISO): 90 U/L (ref 40–115)
BUN: 15 mg/dL (ref 7–25)
CO2: 31 mmol/L (ref 20–32)
Calcium: 9.2 mg/dL (ref 8.6–10.3)
Chloride: 103 mmol/L (ref 98–110)
Creat: 1.09 mg/dL (ref 0.60–1.35)
GFR, Est African American: 103 mL/min/{1.73_m2} (ref >=60)
GFR, Est Non African American: 89 mL/min/{1.73_m2} (ref >=60)
Globulin: 2.4 g/dL (calc) (ref 1.9–3.7)
Glucose, Bld: 154 mg/dL — ABNORMAL HIGH (ref 65–99)
Potassium: 4.3 mmol/L (ref 3.5–5.3)
Sodium: 138 mmol/L (ref 135–146)
Total Bilirubin: 0.5 mg/dL (ref 0.2–1.2)
Total Protein: 6.7 g/dL (ref 6.1–8.1)

## 2018-05-15 LAB — VITAMIN D 25 HYDROXY (VIT D DEFICIENCY, FRACTURES): Vit D, 25-Hydroxy: 34 ng/mL (ref 30–100)

## 2018-05-16 ENCOUNTER — Telehealth: Payer: Self-pay | Admitting: *Deleted

## 2018-05-16 NOTE — Telephone Encounter (Signed)
-----   Message from Vivi BarrackMatthew R Wagoner, DPM sent at 05/16/2018  7:03 AM EST ----- Misty StanleyLisa- please let him know that his blood work was normal except his glucose was 154. Thanks.

## 2018-05-16 NOTE — Telephone Encounter (Signed)
Called and left a message for the patient stating per Dr Ardelle AntonWagoner that the blood work was normal and except for the glucose was 154 and if any concerns or questions to call the office at [phone]. Misty StanleyLisa

## 2018-05-17 DIAGNOSIS — Z4681 Encounter for fitting and adjustment of insulin pump: Secondary | ICD-10-CM | POA: Diagnosis not present

## 2018-05-17 DIAGNOSIS — E108 Type 1 diabetes mellitus with unspecified complications: Secondary | ICD-10-CM | POA: Diagnosis not present

## 2018-05-17 DIAGNOSIS — Z794 Long term (current) use of insulin: Secondary | ICD-10-CM | POA: Diagnosis not present

## 2018-06-14 ENCOUNTER — Ambulatory Visit (INDEPENDENT_AMBULATORY_CARE_PROVIDER_SITE_OTHER): Payer: BLUE CROSS/BLUE SHIELD | Admitting: Podiatry

## 2018-06-14 ENCOUNTER — Ambulatory Visit (INDEPENDENT_AMBULATORY_CARE_PROVIDER_SITE_OTHER): Payer: BLUE CROSS/BLUE SHIELD

## 2018-06-14 ENCOUNTER — Encounter: Payer: Self-pay | Admitting: Podiatry

## 2018-06-14 DIAGNOSIS — M84374A Stress fracture, right foot, initial encounter for fracture: Secondary | ICD-10-CM | POA: Diagnosis not present

## 2018-06-14 DIAGNOSIS — M84374D Stress fracture, right foot, subsequent encounter for fracture with routine healing: Secondary | ICD-10-CM | POA: Diagnosis not present

## 2018-06-16 DIAGNOSIS — M84374D Stress fracture, right foot, subsequent encounter for fracture with routine healing: Secondary | ICD-10-CM | POA: Insufficient documentation

## 2018-06-16 NOTE — Progress Notes (Signed)
Subjective: 32 year old male presents the office today for follow-up evaluation of right foot pain.  He states that overall he is doing much better on the right foot.  The pain and swelling is much improved.  He wears the cam boot the majority the time although he does present today wearing a regular shoe.  He states that he wears a regular shoe if he goes for short distances.  He denies any recent injury or trauma.  He has no new concerns otherwise. Denies any systemic complaints such as fevers, chills, nausea, vomiting. No acute changes since last appointment, and no other complaints at this time.   Objective: AAO x3, NAD DP/PT pulses palpable bilaterally, CRT less than 3 seconds There is no tenderness along the second metatarsal on the right foot.  Overall the edema appears to be much improved and there is no erythema or increase in warmth.There is no other area pinpoint bony tenderness.   No pain in the left foot.  No open lesions or pre-ulcerative lesions.  No pain with calf compression, swelling, warmth, erythema  Assessment: Right second metatarsal fracture, with improvement  Plan: -All treatment options discussed with the patient including all alternatives, risks, complications.  -Repeat x-rays were obtained reviewed with him. There is callus formation along the 2nd metatarsal neck with evidence of healing along the fracture site. No new fracture is identified today. -Overall he is doing better. I would like for him to stay in the surgical boot for the next 1-2 weeks then slowly start to transition to a regular shoe but with his graphite insert. He is wearing that today.  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 If he is having pain or swelling he should not play. He does have a caddy so he won't have to carry his clubs.   Vivi BarrackMatthew R Wagoner DPM

## 2018-07-12 ENCOUNTER — Ambulatory Visit: Payer: BLUE CROSS/BLUE SHIELD | Admitting: Podiatry

## 2018-07-24 ENCOUNTER — Ambulatory Visit: Payer: BLUE CROSS/BLUE SHIELD | Admitting: Gastroenterology

## 2018-07-31 ENCOUNTER — Telehealth: Payer: Self-pay | Admitting: *Deleted

## 2018-07-31 NOTE — Telephone Encounter (Signed)
I reviewed Calvin Baxter's clinicals and he received #90 therapeutic dose on 05/04/2019 and he would need to allow 6-9 months to see healthy outgrowth, and Dr. Ardelle Anton would want to see him at about 6 months to check progress. Calvin Baxter states understanding.

## 2018-07-31 NOTE — Telephone Encounter (Signed)
Pt requested refill of terbinafine.

## 2019-02-21 DIAGNOSIS — K529 Noninfective gastroenteritis and colitis, unspecified: Secondary | ICD-10-CM | POA: Insufficient documentation

## 2019-09-11 DIAGNOSIS — M549 Dorsalgia, unspecified: Secondary | ICD-10-CM | POA: Insufficient documentation

## 2019-10-01 DIAGNOSIS — G629 Polyneuropathy, unspecified: Secondary | ICD-10-CM | POA: Insufficient documentation

## 2019-11-03 ENCOUNTER — Other Ambulatory Visit: Payer: Self-pay

## 2019-11-03 ENCOUNTER — Ambulatory Visit (INDEPENDENT_AMBULATORY_CARE_PROVIDER_SITE_OTHER): Payer: Commercial Managed Care - PPO | Admitting: Podiatry

## 2019-11-03 ENCOUNTER — Encounter: Payer: Self-pay | Admitting: Podiatry

## 2019-11-03 DIAGNOSIS — Z79899 Other long term (current) drug therapy: Secondary | ICD-10-CM

## 2019-11-03 DIAGNOSIS — L600 Ingrowing nail: Secondary | ICD-10-CM

## 2019-11-03 DIAGNOSIS — B351 Tinea unguium: Secondary | ICD-10-CM

## 2019-11-03 MED ORDER — CEPHALEXIN 500 MG PO CAPS: 500.0000 mg | ORAL_CAPSULE | Freq: Three times a day (TID) | ORAL | 0 refills | Status: DC

## 2019-11-03 NOTE — Patient Instructions (Signed)

## 2019-11-04 ENCOUNTER — Telehealth: Payer: Self-pay | Admitting: *Deleted

## 2019-11-04 ENCOUNTER — Other Ambulatory Visit: Payer: Self-pay | Admitting: Podiatry

## 2019-11-04 LAB — CBC WITH DIFFERENTIAL/PLATELET
Absolute Monocytes: 429 cells/uL (ref 200–950)
Basophils Absolute: 20 cells/uL (ref 0–200)
Basophils Relative: 0.3 %
Eosinophils Absolute: 152 cells/uL (ref 15–500)
Eosinophils Relative: 2.3 %
HCT: 43.3 % (ref 38.5–50.0)
Hemoglobin: 15.1 g/dL (ref 13.2–17.1)
Lymphs Abs: 1907 cells/uL (ref 850–3900)
MCH: 33.6 pg — ABNORMAL HIGH (ref 27.0–33.0)
MCHC: 34.9 g/dL (ref 32.0–36.0)
MCV: 96.2 fL (ref 80.0–100.0)
MPV: 10.6 fL (ref 7.5–12.5)
Monocytes Relative: 6.5 %
Neutro Abs: 4092 cells/uL (ref 1500–7800)
Neutrophils Relative %: 62 %
Platelets: 273 10*3/uL (ref 140–400)
RBC: 4.5 10*6/uL (ref 4.20–5.80)
RDW: 12.7 % (ref 11.0–15.0)
Total Lymphocyte: 28.9 %
WBC: 6.6 10*3/uL (ref 3.8–10.8)

## 2019-11-04 LAB — HEPATIC FUNCTION PANEL
AG Ratio: 1.7 (calc) (ref 1.0–2.5)
ALT: 33 U/L (ref 9–46)
AST: 30 U/L (ref 10–40)
Albumin: 4.1 g/dL (ref 3.6–5.1)
Alkaline phosphatase (APISO): 93 U/L (ref 36–130)
Bilirubin, Direct: 0.1 mg/dL (ref 0.0–0.2)
Globulin: 2.4 g/dL (calc) (ref 1.9–3.7)
Indirect Bilirubin: 0.6 mg/dL (calc) (ref 0.2–1.2)
Total Bilirubin: 0.7 mg/dL (ref 0.2–1.2)
Total Protein: 6.5 g/dL (ref 6.1–8.1)

## 2019-11-04 MED ORDER — TERBINAFINE HCL 250 MG PO TABS: 250.0000 mg | ORAL_TABLET | Freq: Every day | ORAL | 0 refills | Status: DC

## 2019-11-04 NOTE — Telephone Encounter (Signed)
Left message informing pt of Dr. Wagoner's review of results and orders. 

## 2019-11-04 NOTE — Telephone Encounter (Signed)
-----   Message from Vivi Barrack, DPM sent at 11/04/2019  5:25 PM EDT ----- Blood work is normal. I have already called in Lamisil if you could please let him know. Thanks.

## 2019-11-11 ENCOUNTER — Encounter: Payer: Self-pay | Admitting: Podiatry

## 2019-11-11 ENCOUNTER — Ambulatory Visit (INDEPENDENT_AMBULATORY_CARE_PROVIDER_SITE_OTHER): Payer: Commercial Managed Care - PPO | Admitting: Podiatry

## 2019-11-11 ENCOUNTER — Other Ambulatory Visit: Payer: Self-pay

## 2019-11-11 DIAGNOSIS — L600 Ingrowing nail: Secondary | ICD-10-CM

## 2019-11-11 DIAGNOSIS — B351 Tinea unguium: Secondary | ICD-10-CM

## 2019-11-11 DIAGNOSIS — Z79899 Other long term (current) drug therapy: Secondary | ICD-10-CM

## 2019-11-11 NOTE — Patient Instructions (Signed)
Terbinafine oral granules What is this medicine? TERBINAFINE (TER bin a feen) is an antifungal medicine. It is used to treat certain kinds of fungal or yeast infections. This medicine may be used for other purposes; ask your health care provider or pharmacist if you have questions. COMMON BRAND NAME(S): Lamisil What should I tell my health care provider before I take this medicine? They need to know if you have any of these conditions:  drink alcoholic beverages  kidney disease  liver disease  an unusual or allergic reaction to Terbinafine, other medicines, foods, dyes, or preservatives  pregnant or trying to get pregnant  breast-feeding How should I use this medicine? Take this medicine by mouth. Follow the directions on the prescription label. Hold packet with cut line on top. Shake packet gently to settle contents. Tear packet open along cut line, or use scissors to cut across line. Carefully pour the entire contents of packet onto a spoonful of a soft food, such as pudding or other soft, non-acidic food such as mashed potatoes (do NOT use applesauce or a fruit-based food). If two packets are required for each dose, you may either sprinkle the content of both packets on one spoonful of non-acidic food, or sprinkle the contents of both packets on two spoonfuls of non-acidic food. Make sure that no granules remain in the packet. Swallow the mxiture of the food and granules without chewing. Take your medicine at regular intervals. Do not take it more often than directed. Take all of your medicine as directed even if you think you are better. Do not skip doses or stop your medicine early. Contact your pediatrician or health care professional regarding the use of this medicine in children. While this medicine may be prescribed for children as young as 4 years for selected conditions, precautions do apply. Overdosage: If you think you have taken too much of this medicine contact a poison control  center or emergency room at once. NOTE: This medicine is only for you. Do not share this medicine with others. What if I miss a dose? If you miss a dose, take it as soon as you can. If it is almost time for your next dose, take only that dose. Do not take double or extra doses. What may interact with this medicine? Do not take this medicine with any of the following medications:  thioridazine This medicine may also interact with the following medications:  beta-blockers  caffeine  cimetidine  cyclosporine  MAOIs like Carbex, Eldepryl, Marplan, Nardil, and Parnate  medicines for fungal infections like fluconazole and ketoconazole  medicines for irregular heartbeat like amiodarone, flecainide and propafenone  rifampin  SSRIs like citalopram, escitalopram, fluoxetine, fluvoxamine, paroxetine and sertraline  tricyclic antidepressants like amitriptyline, clomipramine, desipramine, imipramine, nortriptyline, and others  warfarin This list may not describe all possible interactions. Give your health care provider a list of all the medicines, herbs, non-prescription drugs, or dietary supplements you use. Also tell them if you smoke, drink alcohol, or use illegal drugs. Some items may interact with your medicine. What should I watch for while using this medicine? Your doctor may monitor your liver function. Tell your doctor right away if you have nausea or vomiting, loss of appetite, stomach pain on your right upper side, yellow skin, dark urine, light stools, or are over tired. This medicine may cause serious skin reactions. They can happen weeks to months after starting the medicine. Contact your health care provider right away if you notice fevers or flu-like symptoms   with a rash. The rash may be red or purple and then turn into blisters or peeling of the skin. Or, you might notice a red rash with swelling of the face, lips or lymph nodes in your neck or under your arms. You need to take  this medicine for 6 weeks or longer to cure the fungal infection. Take your medicine regularly for as long as your doctor or health care provider tells you to. What side effects may I notice from receiving this medicine? Side effects that you should report to your doctor or health care professional as soon as possible:  allergic reactions like skin rash or hives, swelling of the face, lips, or tongue  change in vision  dark urine  fever or infection  general ill feeling or flu-like symptoms  light-colored stools  loss of appetite, nausea  rash, fever, and swollen lymph nodes  redness, blistering, peeling or loosening of the skin, including inside the mouth  right upper belly pain  unusually weak or tired  yellowing of the eyes or skin Side effects that usually do not require medical attention (report to your doctor or health care professional if they continue or are bothersome):  changes in taste  diarrhea  hair loss  muscle or joint pain  stomach upset This list may not describe all possible side effects. Call your doctor for medical advice about side effects. You may report side effects to FDA at 1-800-FDA-1088. Where should I keep my medicine? Keep out of the reach of children. Store at room temperature between 15 and 30 degrees C (59 and 86 degrees F). Throw away any unused medicine after the expiration date. NOTE: This sheet is a summary. It may not cover all possible information. If you have questions about this medicine, talk to your doctor, pharmacist, or health care provider.  2020 Elsevier/Gold Standard (2018-09-20 15:35:11)  

## 2019-11-12 NOTE — Progress Notes (Signed)
Subjective: 34 year old male presents the office with concerns of right big toenail becoming ingrown,.  Says some pus as well as redness the nail.  He is diabetic and last A1c was 8.  He is try to trim it himself.  Also has not nail fungus treatments.  Consequently I will metatarsal somewhat discolored. Denies any systemic complaints such as fevers, chills, nausea, vomiting. No acute changes since last appointment, and no other complaints at this time.   Objective: AAO x3, NAD DP/PT pulses palpable bilaterally, CRT less than 3 seconds Previously diagnosed neuropathy Incurvation present to right hallux toenail localized edema and erythema there is mild purulence coming from the nail borders.  There is no ascending cellulitis.  No fluctuation crepitation present malodor.  In general nail plate.  Mildly hypertrophic, dystrophic with yellow discoloration. No pain with calf compression, swelling, warmth, erythema  Assessment: Ingrown toenail right hallux, onychomycosis  Plan: -All treatment options discussed with the patient including all alternatives, risks, complications.  -At this time, recommended partial nail removal without chemical matricectomy to the right due to infection. Risks and complications were discussed with the patient for which they understand and  verbally consent to the procedure. Under sterile conditions a total of 3 mL of a mixture of 2% lidocaine plain and 0.5% Marcaine plain was infiltrated in a hallux block fashion. Once anesthetized, the skin was prepped in sterile fashion. A tourniquet was then applied. Next the symptomatic border of the hallux nail border was sharply excised making sure to remove the entire offending nail border. Once the nail was  Removed, the area was debrided and the underlying skin was intact. The area was irrigated and hemostasis was obtained.  A dry sterile dressing was applied. After application of the dressing the tourniquet was removed and there is  found to be an immediate capillary refill time to the digit. The patient tolerated the procedure well any complications. Post procedure instructions were discussed the patient for which he verbally understood. Follow-up in one week for nail check or sooner if any problems are to arise. Discussed signs/symptoms of worsening infection and directed to call the office immediately should any occur or go directly to the emergency room. In the meantime, encouraged to call the office with any questions, concerns, changes symptoms. -Keflex -Treatments for nail fungus.  He also do another round of Lamisil.  He is currently not drinking.  Discussed this could negatively affect the liver with Lamisil.  We will check a CBC and LFT prior to starting medication.  Return for nail check-right big toe.  Vivi Barrack DPM

## 2019-11-18 NOTE — Progress Notes (Signed)
Subjective: Calvin Baxter is a 34 y.o.  male returns to office today for follow up evaluation after having right Hallux partial nail avulsion performed. Patient has been soaking using epsom salts and applying topical antibiotic covered with bandaid daily.  Denies any pain currently denies any redness or drainage or any signs of infection.  He started Lamisil.  Patient denies fevers, chills, nausea, vomiting. Denies any calf pain, chest pain, SOB.   Objective:  Vitals: Reviewed  General: Well developed, nourished, in no acute distress, alert and oriented x3   Dermatology: Skin is warm, dry and supple bilateral. Right hallux nail border appears to be clean, dry, with mild granular tissue and surrounding scab. There is no surrounding erythema, edema, drainage/purulence. The remaining nails appear unremarkable at this time. There are no other lesions or other signs of infection present.  Neurovascular status: Intact. No lower extremity swelling; No pain with calf compression bilateral.  Musculoskeletal: Decreased tenderness to palpation of the right hallux nail fold. Muscular strength within normal limits bilateral.   Assesement and Plan: S/p partial nail avulsion, doing well.   -Continue soaking in epsom salts twice a day followed by antibiotic ointment and a band-aid. Can leave uncovered at night. Continue this until completely healed.  -If the area has not healed in 2 weeks, call the office for follow-up appointment, or sooner if any problems arise.  -Continue Lamisil and monitor for any side effects.  He tolerated this well previously.  He states he is currently not drinking and discussed that drinking can negatively affect the liver as well as the Lamisil. -Monitor for any signs/symptoms of infection. Call the office immediately if any occur or go directly to the emergency room. Call with any questions/concerns.  Ovid Curd, DPM

## 2019-11-29 ENCOUNTER — Ambulatory Visit: Payer: Commercial Managed Care - PPO | Admitting: Podiatry

## 2019-12-08 ENCOUNTER — Telehealth: Payer: Self-pay | Admitting: *Deleted

## 2019-12-08 NOTE — Telephone Encounter (Signed)
Yes, that is true but he does have strong palpable pulses. Can you please put the referral in. I am not sure who that doctor is but it is OK to put the referral in. Also, can you see how the toenail is healing?? Thanks.

## 2019-12-08 NOTE — Telephone Encounter (Signed)
Pt states he has a blood flow problem in his feet and legs, and orthotic doctor told him his x-rays show he has calcium build up and can give a false positive, he would like to be referred to a certain doctor Dr. Roxy Manns [phone], and would like to know if Dr. Ardelle Anton would make the referral.

## 2019-12-09 NOTE — Telephone Encounter (Signed)
Left message informing pt of Dr. Gabriel Rung statement.

## 2019-12-09 NOTE — Telephone Encounter (Signed)
Unable to leave message work phone to request the name of D. [facility].

## 2019-12-23 ENCOUNTER — Ambulatory Visit: Payer: Commercial Managed Care - PPO | Admitting: Podiatry

## 2020-01-14 ENCOUNTER — Other Ambulatory Visit: Payer: Self-pay

## 2020-01-14 DIAGNOSIS — I779 Disorder of arteries and arterioles, unspecified: Secondary | ICD-10-CM

## 2020-01-27 ENCOUNTER — Encounter: Payer: Commercial Managed Care - PPO | Admitting: Vascular Surgery

## 2020-01-27 ENCOUNTER — Inpatient Hospital Stay (HOSPITAL_COMMUNITY): Admission: RE | Admit: 2020-01-27 | Payer: Commercial Managed Care - PPO | Source: Ambulatory Visit

## 2020-02-13 DIAGNOSIS — F410 Panic disorder [episodic paroxysmal anxiety] without agoraphobia: Secondary | ICD-10-CM | POA: Insufficient documentation

## 2020-02-13 DIAGNOSIS — E1142 Type 2 diabetes mellitus with diabetic polyneuropathy: Secondary | ICD-10-CM | POA: Insufficient documentation

## 2020-03-19 ENCOUNTER — Encounter: Payer: Self-pay | Admitting: Sports Medicine

## 2020-03-19 ENCOUNTER — Ambulatory Visit (INDEPENDENT_AMBULATORY_CARE_PROVIDER_SITE_OTHER): Payer: Commercial Managed Care - PPO | Admitting: Sports Medicine

## 2020-03-19 ENCOUNTER — Other Ambulatory Visit: Payer: Self-pay

## 2020-03-19 DIAGNOSIS — M79675 Pain in left toe(s): Secondary | ICD-10-CM

## 2020-03-19 DIAGNOSIS — L600 Ingrowing nail: Secondary | ICD-10-CM | POA: Diagnosis not present

## 2020-03-19 MED ORDER — AMOXICILLIN-POT CLAVULANATE 875-125 MG PO TABS: 1.0000 | ORAL_TABLET | Freq: Two times a day (BID) | ORAL | 0 refills | Status: DC

## 2020-03-19 NOTE — Progress Notes (Signed)
Subjective: Calvin Baxter is a 34 y.o. male patient presents to office today complaining of a moderately painful incurvated, red, hot, swollen lateral>medial nail border of the 1st toe on the leftfoot. This has been present for 6 months. Patient has treated this by soaking.  Reports that it seems to be a little bit better but still very tender with swelling and redness from the left big toe. Patient denies fever/chills/nausea/vomitting/any other related constitutional symptoms at this time.  Patient is type I diabetic.  Fasting blood sugar not recorded.  Patient Active Problem List   Diagnosis Date Noted   Diabetic peripheral neuropathy (HCC) 02/13/2020   Panic disorder 02/13/2020   Stress fracture, right foot, subsequent encounter for fracture with routine healing 06/16/2018   Acute appendicitis, uncomplicated 12/26/2017   S/P laparoscopic appendectomy 12/26/2017   Nausea without vomiting 06/28/2017   Alcoholism (HCC) 06/28/2017   Type I diabetes mellitus with complication, uncontrolled (HCC) 06/28/2017   Dizziness 03/13/2014    Current Outpatient Medications on File Prior to Visit  Medication Sig Dispense Refill   desvenlafaxine (PRISTIQ) 50 MG 24 hr tablet Take by mouth.     busPIRone (BUSPAR) 30 MG tablet Take 15 mg by mouth 2 (two) times daily.   0   busPIRone (BUSPAR) 7.5 MG tablet Take 7.5 mg by mouth 2 (two) times daily.     cephALEXin (KEFLEX) 500 MG capsule Take 1 capsule (500 mg total) by mouth 3 (three) times daily. 21 capsule 0   clonazePAM (KLONOPIN) 0.5 MG tablet Take 0.5 mg by mouth 2 (two) times daily.   0   Continuous Blood Gluc Sensor (FREESTYLE LIBRE 14 DAY SENSOR) MISC USE TO MONITOR CONTINUOUS BLOOD SUGAR. CHANGE EVERY 14 DAYS.  5   fexofenadine (ALLEGRA) 180 MG tablet Take 180 mg by mouth daily.     fluticasone (FLONASE) 50 MCG/ACT nasal spray fluticasone propionate 50 mcg/actuation nasal spray,suspension     fluvoxaMINE (LUVOX) 25 MG tablet Take  50 mg by mouth at bedtime.   0   gabapentin (NEURONTIN) 300 MG capsule Take 300-900 mg by mouth See admin instructions. Take 300 mg in the morning and 900 mg at night  0   Ginger, Zingiber officinalis, (GINGER PO) Take 1 tablet by mouth daily.     glucagon (GLUCAGON EMERGENCY) 1 MG injection Inject 1 mg into the vein once as needed (in emergency situation).     HUMALOG 100 UNIT/ML injection      insulin aspart protamine- aspart (NOVOLOG MIX 70/30) (70-30) 100 UNIT/ML injection Inject into the skin continuous. Use as directed in insulin pump, max 100 units/day      Insulin Disposable Pump MISC by Does not apply route.     NOVOLOG 100 UNIT/ML injection USE AS DIRECTED IN INSULIN PUMP. MAXIMUM OF 100 UNITS PER DAY.  3   ondansetron (ZOFRAN ODT) 8 MG disintegrating tablet Take 1 tablet (8 mg total) by mouth every 8 (eight) hours as needed for nausea or vomiting. 30 tablet 3   propranolol (INDERAL) 10 MG tablet Take 5 mg by mouth 2 (two) times daily.  1   rosuvastatin (CRESTOR) 20 MG tablet Take 20 mg by mouth once a week.     terbinafine (LAMISIL) 250 MG tablet Take 1 tablet (250 mg total) by mouth daily. 90 tablet 0   valACYclovir (VALTREX) 1000 MG tablet Take 1,000 mg by mouth 3 (three) times daily.     No current facility-administered medications on file prior to visit.    No  Known Allergies  Objective:  There were no vitals filed for this visit.  General: Well developed, nourished, in no acute distress, alert and oriented x3   Dermatology: Skin is warm, dry and supple bilateral.  Left hallux nail appears to be  severely incurvated with hyperkeratosis formation at the distal aspects of  the medial and lateral nail borders. (+) Erythema. (+) Edema. (-) serosanguous  drainage present. The remaining nails appear unremarkable at this time. There are no open sores, lesions or other signs of infection  present.  Vascular: Dorsalis Pedis artery and Posterior Tibial artery pedal  pulses are 2/4 bilateral with immedate capillary fill time. Pedal hair growth present. No lower extremity edema.   Neruologic: Grossly intact via light touch bilateral.  Musculoskeletal: Tenderness to palpation of the left hallux nail fold(s). Muscular strength within normal limits in all groups bilateral.   Assesement and Plan: Problem List Items Addressed This Visit    None    Visit Diagnoses    Ingrown toenail    -  Primary   Toe pain, left          -Discussed treatment alternatives and plan of care; Explained permanent/temporary nail avulsion and post procedure course to patient. Patient elects for PNA left hallux medial and lateral margins - After a verbal and written consent, injected 3 ml of a 50:50 mixture of 2% plain  lidocaine and 0.5% plain marcaine in a normal hallux block fashion. Next, a  betadine prep was performed. Anesthesia was tested and found to be appropriate.  The offending left hallux medial and lateral nail border was then incised from the hyponychium to the epinychium. The offending nail border was removed and cleared from the field. The area was curretted for any remaining nail or spicules. Phenol application performed and the area was then flushed with alcohol and dressed with antibiotic cream and a dry sterile dressing. -Patient was instructed to leave the dressing intact for today and begin soaking  in a weak solution of betadine or Epsom salt and water tomorrow. Patient was instructed to  soak for 15-20 minutes each day and apply neosporin/corticosporin and a gauze or bandaid dressing each day. -Patient was instructed to monitor the toe for signs of infection and return to office if toe becomes red, hot or swollen. -Prescribed Augmentin for preventative measures in the setting of diabetes -Advised ice, elevation, and tylenol or motrin if needed for pain.  -Patient is to return in 2 weeks for follow up care/nail check or sooner if problems arise.  Asencion Islam, DPM

## 2020-03-19 NOTE — Patient Instructions (Signed)

## 2020-04-02 ENCOUNTER — Ambulatory Visit: Payer: Commercial Managed Care - PPO | Admitting: Sports Medicine

## 2020-04-19 ENCOUNTER — Encounter (HOSPITAL_COMMUNITY): Payer: Self-pay | Admitting: Emergency Medicine

## 2020-04-19 ENCOUNTER — Emergency Department (HOSPITAL_COMMUNITY)
Admission: EM | Admit: 2020-04-19 | Discharge: 2020-04-20 | Disposition: A | Discharge status: Home / Self Care | Payer: Commercial Managed Care - PPO | Attending: Emergency Medicine | Admitting: Emergency Medicine

## 2020-04-19 ENCOUNTER — Other Ambulatory Visit: Payer: Self-pay

## 2020-04-19 ENCOUNTER — Ambulatory Visit (HOSPITAL_COMMUNITY)
Admission: EM | Admit: 2020-04-19 | Discharge: 2020-04-19 | Disposition: A | Discharge status: Home / Self Care | Payer: Commercial Managed Care - PPO

## 2020-04-19 ENCOUNTER — Emergency Department (HOSPITAL_COMMUNITY): Payer: Commercial Managed Care - PPO

## 2020-04-19 DIAGNOSIS — J45909 Unspecified asthma, uncomplicated: Secondary | ICD-10-CM | POA: Diagnosis not present

## 2020-04-19 DIAGNOSIS — R739 Hyperglycemia, unspecified: Secondary | ICD-10-CM

## 2020-04-19 DIAGNOSIS — Z794 Long term (current) use of insulin: Secondary | ICD-10-CM | POA: Insufficient documentation

## 2020-04-19 DIAGNOSIS — K529 Noninfective gastroenteritis and colitis, unspecified: Secondary | ICD-10-CM | POA: Insufficient documentation

## 2020-04-19 DIAGNOSIS — Z87891 Personal history of nicotine dependence: Secondary | ICD-10-CM | POA: Diagnosis not present

## 2020-04-19 DIAGNOSIS — Z7984 Long term (current) use of oral hypoglycemic drugs: Secondary | ICD-10-CM | POA: Diagnosis not present

## 2020-04-19 DIAGNOSIS — R197 Diarrhea, unspecified: Secondary | ICD-10-CM

## 2020-04-19 DIAGNOSIS — E1065 Type 1 diabetes mellitus with hyperglycemia: Secondary | ICD-10-CM | POA: Insufficient documentation

## 2020-04-19 DIAGNOSIS — R42 Dizziness and giddiness: Secondary | ICD-10-CM | POA: Insufficient documentation

## 2020-04-19 DIAGNOSIS — E1040 Type 1 diabetes mellitus with diabetic neuropathy, unspecified: Secondary | ICD-10-CM | POA: Diagnosis not present

## 2020-04-19 LAB — COMPREHENSIVE METABOLIC PANEL
ALT: 34 U/L (ref 0–44)
AST: 39 U/L (ref 15–41)
Albumin: 4 g/dL (ref 3.5–5.0)
Alkaline Phosphatase: 115 U/L (ref 38–126)
Anion gap: 10 (ref 5–15)
BUN: 12 mg/dL (ref 6–20)
CO2: 27 mmol/L (ref 22–32)
Calcium: 9.7 mg/dL (ref 8.9–10.3)
Chloride: 97 mmol/L — ABNORMAL LOW (ref 98–111)
Creatinine, Ser: 1.33 mg/dL — ABNORMAL HIGH (ref 0.61–1.24)
GFR, Estimated: >60 mL/min (ref >60)
Glucose, Bld: 359 mg/dL — ABNORMAL HIGH (ref 70–99)
Potassium: 6.4 mmol/L (ref 3.5–5.1)
Sodium: 134 mmol/L — ABNORMAL LOW (ref 135–145)
Total Bilirubin: 0.8 mg/dL (ref 0.3–1.2)
Total Protein: 7.4 g/dL (ref 6.5–8.1)

## 2020-04-19 LAB — I-STAT VENOUS BLOOD GAS, ED
Acid-Base Excess: 6 mmol/L — ABNORMAL HIGH (ref 0.0–2.0)
Bicarbonate: 32.6 mmol/L — ABNORMAL HIGH (ref 20.0–28.0)
Calcium, Ion: 1.25 mmol/L (ref 1.15–1.40)
HCT: 46 % (ref 39.0–52.0)
Hemoglobin: 15.6 g/dL (ref 13.0–17.0)
O2 Saturation: 40 %
Potassium: 4.7 mmol/L (ref 3.5–5.1)
Sodium: 139 mmol/L (ref 135–145)
TCO2: 34 mmol/L — ABNORMAL HIGH (ref 22–32)
pCO2, Ven: 53.4 mmHg (ref 44.0–60.0)
pH, Ven: 7.394 (ref 7.250–7.430)
pO2, Ven: 24 mmHg — CL (ref 32.0–45.0)

## 2020-04-19 LAB — I-STAT CHEM 8, ED
BUN: 15 mg/dL (ref 6–20)
Calcium, Ion: 1.25 mmol/L (ref 1.15–1.40)
Chloride: 100 mmol/L (ref 98–111)
Creatinine, Ser: 1.2 mg/dL (ref 0.61–1.24)
Glucose, Bld: 140 mg/dL — ABNORMAL HIGH (ref 70–99)
HCT: 45 % (ref 39.0–52.0)
Hemoglobin: 15.3 g/dL (ref 13.0–17.0)
Potassium: 4.7 mmol/L (ref 3.5–5.1)
Sodium: 138 mmol/L (ref 135–145)
TCO2: 31 mmol/L (ref 22–32)

## 2020-04-19 LAB — CBG MONITORING, ED
Glucose-Capillary: 325 mg/dL — ABNORMAL HIGH (ref 70–99)
Glucose-Capillary: 166 mg/dL — ABNORMAL HIGH (ref 70–99)
Glucose-Capillary: 106 mg/dL — ABNORMAL HIGH (ref 70–99)
Glucose-Capillary: 89 mg/dL (ref 70–99)

## 2020-04-19 LAB — URINALYSIS, ROUTINE W REFLEX MICROSCOPIC
Bacteria, UA: NONE SEEN
Bilirubin Urine: NEGATIVE
Glucose, UA: >=500 mg/dL — AB
Hgb urine dipstick: NEGATIVE
Ketones, ur: NEGATIVE mg/dL
Leukocytes,Ua: NEGATIVE
Nitrite: NEGATIVE
Protein, ur: NEGATIVE mg/dL
Specific Gravity, Urine: 1.006 (ref 1.005–1.030)
pH: 7 (ref 5.0–8.0)

## 2020-04-19 LAB — CBC
HCT: 46.6 % (ref 39.0–52.0)
Hemoglobin: 16.7 g/dL (ref 13.0–17.0)
MCH: 34.6 pg — ABNORMAL HIGH (ref 26.0–34.0)
MCHC: 35.8 g/dL (ref 30.0–36.0)
MCV: 96.7 fL (ref 80.0–100.0)
Platelets: 288 10*3/uL (ref 150–400)
RBC: 4.82 MIL/uL (ref 4.22–5.81)
RDW: 12 % (ref 11.5–15.5)
WBC: 13.9 10*3/uL — ABNORMAL HIGH (ref 4.0–10.5)
nRBC: 0 % (ref 0.0–0.2)

## 2020-04-19 LAB — LIPASE, BLOOD: Lipase: 22 U/L (ref 11–51)

## 2020-04-19 MED ORDER — SODIUM CHLORIDE 0.9 % IV BOLUS
1000.0000 mL | Freq: Once | INTRAVENOUS | Status: AC
  Administered 2020-04-19: 1000 mL via INTRAVENOUS

## 2020-04-19 MED ORDER — IOHEXOL 300 MG/ML  SOLN
100.0000 mL | Freq: Once | INTRAMUSCULAR | Status: AC | PRN
  Administered 2020-04-19: 100 mL via INTRAVENOUS

## 2020-04-19 MED ORDER — MECLIZINE HCL 25 MG PO TABS
25.0000 mg | ORAL_TABLET | Freq: Once | ORAL | Status: AC
  Administered 2020-04-20: 25 mg via ORAL
  Filled 2020-04-19: qty 1

## 2020-04-19 MED ORDER — ONDANSETRON HCL 4 MG/2ML IJ SOLN
4.0000 mg | Freq: Once | INTRAMUSCULAR | Status: AC
  Administered 2020-04-19: 4 mg via INTRAVENOUS
  Filled 2020-04-19: qty 2

## 2020-04-19 NOTE — ED Provider Notes (Signed)
[facility] EMERGENCY DEPARTMENT Provider Note   CSN: [id number] Arrival date & time: 04/19/20  1625     History Chief Complaint  Patient presents with   Dizziness   Diarrhea    Calvin Baxter is a 34 y.o. male with PMH/o alcohol, dizziness, GERD, DM type 1 who presents for evaluation of 5-6 days of generalized weakness, diarrhea, feeling lightheaded and dizziness.  He states he has a history of type 1 diabetes and is insulin-dependent.  He states he is still been using his insulin but states his dexicom ran out and he has had difficulty checking his blood sugars.  He does report they have been higher in the 300s this week.  He states he has not any vomiting but has felt nauseous and has had several episodes of diarrhea.  He states that he had one measured fever over the last week after he was sitting under blankets but has not measured any fever since then.  He states he has had intermittent dizziness for the last several days as well.  He states that this comes and goes and does not continuously last.  Currently denies any dizziness.  He states he has had some ringing in his ear.  He called his primary care doctor and was advised to come to the ED to ensure that he is not in DKA.  He denies any chest pain, difficulty breathing, abdominal pain, dysuria, hematuria.  He has been peeing more frequently.  The history is provided by the patient.       Past Medical History:  Diagnosis Date   Alcoholism (HCC)    Anxiety, generalized    on lexapro   Childhood asthma    Dizziness    GERD (gastroesophageal reflux disease)    Neuropathy    diagnosed by Dr Ralene Cork per patient   Type I diabetes mellitus [facility])     Patient Active Problem List   Diagnosis Date Noted   Diabetic peripheral neuropathy (HCC) 02/13/2020   Panic disorder 02/13/2020   Stress fracture, right foot, subsequent encounter for fracture with routine healing 06/16/2018   Acute appendicitis,  uncomplicated 12/26/2017   S/P laparoscopic appendectomy 12/26/2017   Nausea without vomiting 06/28/2017   Alcoholism (HCC) 06/28/2017   Type I diabetes mellitus with complication, uncontrolled (HCC) 06/28/2017   Dizziness 03/13/2014    Past Surgical History:  Procedure Laterality Date   LAPAROSCOPIC APPENDECTOMY N/A 12/26/2017   Procedure: APPENDECTOMY LAPAROSCOPIC;  Surgeon: Andria Meuse, MD;  Location: [facility] OR;  Service: General;  Laterality: N/A;   NO PAST SURGERIES     None         Family History  Problem Relation Age of Onset   Breast cancer Maternal Grandmother    Colon cancer Neg Hx    Liver cancer Neg Hx    Rectal cancer Neg Hx    Stomach cancer Neg Hx    Esophageal cancer Neg Hx     Social History   Tobacco Use   Smoking status: Former Smoker    Packs/day: 1.00    Years: 2.00    Pack years: 2.00    Types: Cigarettes    Quit date: 2008    Years since quitting: 13.8   Smokeless tobacco: Former Neurosurgeon    Types: Chew    Quit date: 07/28/2015  Vaping Use   Vaping Use: Never used  Substance Use Topics   Alcohol use: Not Currently    Comment: 12/26/2017 "stopped Thanksgiving day 2018; history  of alcoholism"   Drug use: Never    Home Medications Prior to Admission medications   Medication Sig Start Date End Date Taking? Authorizing Provider  busPIRone (BUSPAR) 15 MG tablet Take 7.5 mg by mouth 2 (two) times daily.  04/13/20  Yes [provider]  clonazePAM (KLONOPIN) 0.5 MG tablet Take 0.5 mg by mouth in the morning, at noon, and at bedtime.  06/13/17  Yes [provider]  fexofenadine (ALLEGRA) 180 MG tablet Take 180 mg by mouth daily.   Yes [provider]  fluvoxaMINE (LUVOX) 100 MG tablet Take 50 mg by mouth at bedtime.  04/07/20  Yes [provider]  gabapentin (NEURONTIN) 100 MG capsule Take 200 mg by mouth daily as needed (pain).  04/07/20  Yes [provider]  gabapentin (NEURONTIN) 800 MG  tablet Take 800 mg by mouth at bedtime.  04/08/20  Yes [provider]  Ginger, Zingiber officinalis, (GINGER PO) Take 1 tablet by mouth daily.   Yes [provider]  glucagon (GLUCAGON EMERGENCY) 1 MG injection Inject 1 mg into the vein once as needed (in emergency situation).   Yes [provider]  insulin aspart protamine- aspart (NOVOLOG MIX 70/30) (70-30) 100 UNIT/ML injection Inject into the skin continuous. Use as directed in insulin pump, max 100 units/day    Yes [provider]  amoxicillin-clavulanate (AUGMENTIN) 875-125 MG tablet Take 1 tablet by mouth every 12 (twelve) hours. 04/19/20   Maxwell Caul, PA-C  cephALEXin (KEFLEX) 500 MG capsule Take 1 capsule (500 mg total) by mouth 3 (three) times daily. Patient not taking: Reported on 04/19/2020 11/03/19   Vivi Barrack, DPM  Continuous Blood Gluc Sensor (FREESTYLE LIBRE 14 DAY SENSOR) MISC USE TO MONITOR CONTINUOUS BLOOD SUGAR. CHANGE EVERY 14 DAYS. 04/27/18   [provider]  Insulin Disposable Pump MISC by Does not apply route.    [provider]  meclizine (ANTIVERT) 25 MG tablet Take 1 tablet (25 mg total) by mouth 3 (three) times daily as needed for dizziness. 04/19/20   Maxwell Caul, PA-C  ondansetron (ZOFRAN ODT) 8 MG disintegrating tablet Take 1 tablet (8 mg total) by mouth every 8 (eight) hours as needed for nausea or vomiting. Patient not taking: Reported on 04/19/2020 06/28/17   Benancio Deeds, MD  terbinafine (LAMISIL) 250 MG tablet Take 1 tablet (250 mg total) by mouth daily. Patient not taking: Reported on 04/19/2020 11/04/19   Vivi Barrack, DPM    Allergies    Patient has no known allergies.  Review of Systems   Review of Systems  Constitutional: Negative for fever.  HENT: Positive for tinnitus.   Respiratory: Negative for cough and shortness of breath.   Cardiovascular: Negative for chest pain.  Gastrointestinal: Positive for diarrhea and  nausea. Negative for abdominal pain and vomiting.  Genitourinary: Negative for dysuria and hematuria.  Neurological: Positive for dizziness, weakness (generalized) and light-headedness. Negative for headaches.  All other systems reviewed and are negative.   Physical Exam Updated Vital Signs BP 107/84    Pulse 80    Temp 98.4 F (36.9 C)    Resp 18    Ht  (1.753 m)    Wt 80.7 kg    SpO2 98%    BMI 26.29 kg/m   Physical Exam Vitals and nursing note reviewed.  Constitutional:      Appearance: Normal appearance. He is well-developed.  HENT:     Head: Normocephalic and atraumatic.  Eyes:  General: Lids are normal.     Conjunctiva/sclera: Conjunctivae normal.     Pupils: Pupils are equal, round, and reactive to light.     Comments: PERRL. EOMs intact. No nystagmus. No neglect.   Neck:     Vascular: No carotid bruit.  Cardiovascular:     Rate and Rhythm: Normal rate and regular rhythm.     Pulses: Normal pulses.     Heart sounds: Normal heart sounds. No murmur heard.  No friction rub. No gallop.   Pulmonary:     Effort: Pulmonary effort is normal.     Breath sounds: Normal breath sounds.     Comments: Lungs clear to auscultation bilaterally.  Symmetric chest rise.  No wheezing, rales, rhonchi. Abdominal:     Palpations: Abdomen is soft. Abdomen is not rigid.     Tenderness: There is no abdominal tenderness. There is no guarding.     Comments: Abdomen is soft, non-distended, non-tender. No rigidity, No guarding. No peritoneal signs. No CVA tenderness.   Musculoskeletal:        General: Normal range of motion.     Cervical back: Full passive range of motion without pain.  Skin:    General: Skin is warm and dry.     Capillary Refill: Capillary refill takes less than 2 seconds.  Neurological:     Mental Status: He is alert and oriented to person, place, and time.     Comments: Cranial nerves III-XII intact Follows commands, Moves all extremities  5/5 strength to BUE and  BLE  Sensation intact throughout all major nerve distributions No gait abnormalities. No gait ataxia.  No slurred speech. No facial droop.   Psychiatric:        Speech: Speech normal.     ED Results / Procedures / Treatments   Labs (all labs ordered are listed, but only abnormal results are displayed) Labs Reviewed  COMPREHENSIVE METABOLIC PANEL - Abnormal; Notable for the following components:      Result Value   Sodium 134 (*)    Potassium 6.4 (*)    Chloride 97 (*)    Glucose, Bld 359 (*)    Creatinine, Ser 1.33 (*)    All other components within normal limits  CBC - Abnormal; Notable for the following components:   WBC 13.9 (*)    MCH 34.6 (*)    All other components within normal limits  URINALYSIS, ROUTINE W REFLEX MICROSCOPIC - Abnormal; Notable for the following components:   Color, Urine STRAW (*)    Glucose, UA >=500 (*)    All other components within normal limits  CBG MONITORING, ED - Abnormal; Notable for the following components:   Glucose-Capillary 325 (*)    All other components within normal limits  CBG MONITORING, ED - Abnormal; Notable for the following components:   Glucose-Capillary 166 (*)    All other components within normal limits  I-STAT CHEM 8, ED - Abnormal; Notable for the following components:   Glucose, Bld 140 (*)    All other components within normal limits  I-STAT VENOUS BLOOD GAS, ED - Abnormal; Notable for the following components:   pO2, Ven 24.0 (*)    Bicarbonate 32.6 (*)    TCO2 34 (*)    Acid-Base Excess 6.0 (*)    All other components within normal limits  CBG MONITORING, ED - Abnormal; Notable for the following components:   Glucose-Capillary 106 (*)    All other components within normal limits  LIPASE, BLOOD  CBG MONITORING, ED    EKG None  Radiology CT ABDOMEN PELVIS W CONTRAST  Result Date: 04/19/2020 CLINICAL DATA:  Diarrhea and nausea EXAM: CT ABDOMEN AND PELVIS WITH CONTRAST TECHNIQUE: Multidetector CT imaging  of the abdomen and pelvis was performed using the standard protocol following bolus administration of intravenous contrast. CONTRAST:  OMNIPAQUE IOHEXOL 300 MG/ML  SOLN COMPARISON:  None. FINDINGS: Lower chest: The visualized heart size within normal limits. No pericardial fluid/thickening. No hiatal hernia. The visualized portions of the lungs are clear. Hepatobiliary: The liver is normal in density without focal abnormality.The main portal vein is patent. No evidence of calcified gallstones, gallbladder wall thickening or biliary dilatation. Pancreas: Unremarkable. No pancreatic ductal dilatation or surrounding inflammatory changes. Spleen: Normal in size without focal abnormality. Adrenals/Urinary Tract: Both adrenal glands appear normal. The kidneys and collecting system appear normal without evidence of urinary tract calculus or hydronephrosis. Bladder is unremarkable. Stomach/Bowel: The stomach, small bowel are normal in appearance. There is question of mild wall thickening seen at the sigmoid rectal junction which may be due to underdistention versus true wall thickening. No loculated fluid collections or free air. The patient is status post appendectomy. Vascular/Lymphatic: There are no enlarged mesenteric, retroperitoneal, or pelvic lymph nodes. No significant vascular findings are present. Reproductive: The prostate is unremarkable. Other: No evidence of abdominal wall mass or hernia. Musculoskeletal: No acute or significant osseous findings. IMPRESSION: Apparent wall thickening at the sigmoid rectal junction which may be due to true mild wall thickening/colitis versus physiologic under distension. No loculated fluid collections or free air. Electronically Signed   By: Jonna Clark M.D.   On: 04/19/2020 22:07    Procedures Procedures (including critical care time)  Medications Ordered in ED Medications  sodium chloride 0.9 % bolus 1,000 mL (1,000 mLs Intravenous New Bag/Given 04/19/20 2128)    ondansetron (ZOFRAN) injection 4 mg (4 mg Intravenous Given 04/19/20 2128)  iohexol (OMNIPAQUE) 300 MG/ML solution 100 mL (100 mLs Intravenous Contrast Given 04/19/20 2146)  meclizine (ANTIVERT) tablet 25 mg (25 mg Oral Given 04/20/20 0008)    ED Course  I have reviewed the triage vital signs and the nursing notes.  Pertinent labs & imaging results that were available during my care of the patient were reviewed by me and considered in my medical decision making (see chart for details).    MDM Rules/Calculators/A&P                          34 year old male past mostly of type 1 diabetes who presents for evaluation of generalized weakness, nausea, diarrhea that has been ongoing for several days.  He also reports hyperglycemia.  He states that he recently ran out of his Dexicom so he has been having trouble managing his blood sugars.  He is still been taking his insulin.  He states he has had several episodes of diarrhea but no vomiting.  He states he has not had any abdominal pain.  He denies any fevers.  He called his doctor today and was advised to go to the emergency department for evaluation to ensure that he was not in DKA.  He does states that he has been in it once before about 15 years ago and states that this does not feel as bad.  On initially arrival, he is afebrile, nontoxic-appearing.  Vital signs are stable.  On exam, no abdominal tenderness.  Will obtain blood work for evaluation of DKA.  Also consider infectious  etiology.  I suspect he may have some degree of Mnire's disease given history of dizziness and tinnitus.  He states that this has been intermittently occurring over the last several days.  Currently denies any dizziness and I ambulated the patient.  Patient with no neuro deficits.  No gait ataxia.  History/physical exam not concerning for CVA.   Will obtain blood work.  CMP showed potassium of 6.4. We will plan to repeat the potassium.  Glucose of 359, BUN of 12, creatinine  of 1.33.  Bicarb is 27, anion gap is 10.  Work-up not consistent with DKA.  CBC shows a leukocytosis of 13.9.  Otherwise stable.  Lipase unremarkable.  UA shows glucosuria.  No evidence of ketones, infectious etiology.  We will repeat a Chem-8 to ensure that this potassium is accurate.  Will give patient fluids.  He does have a leukocytosis here of 13.9.  No abdominal tenderness but he has had diffuse diarrhea.  We will plan for CT on pelvis for evaluation.  Repeat CBG is 106 without any intervention.  Chem-8 shows BUN and creatinine of 15, 1.20.  Potassium is 4.7.  VBG shows a pH of 7.3.  Bicarb is 32.  At this time, patient's work-up not consistent with DKA.  CT ab abdomen pelvis shows apparent wall thickening at the sigmoid rectal junction which may be due to mild wall thickening colitis versus physiologic underdistention.  No evidence of fluid collections or free air.  Repeat CBG is 89.  Patient tolerating p.o. at this time.  He is hemodynamically stable.  At this time, patient is resting comfortably.  He is ambulating stable.  Patient denies any dizziness at this time.  He has no signs of gait ataxia or neuro deficits on my exam.  His dizziness/lightheadedness sensation is intermittent and is not currently occurring.  We will give him meclizine for possible Mnire's season will have him referred to ear nose and throat doctor.  Additionally, given the colitis seen on his abdominal CT as well as continued persistent pain diarrhea, plan to start him on augmentin. At this time, patient exhibits no emergent life-threatening condition that require further evaluation in ED. Patient had ample opportunity for questions and discussion. All patient's questions were answered with full understanding. Strict return precautions discussed. Patient expresses understanding and agreement to plan.   Portions of this note were generated with Scientist, clinical (histocompatibility and immunogenetics). Dictation errors may occur despite best attempts at  proofreading.   Final Clinical Impression(s) / ED Diagnoses Final diagnoses:  Hyperglycemia  Colitis  Diarrhea, unspecified type    Rx / DC Orders ED Discharge Orders         Ordered    amoxicillin-clavulanate (AUGMENTIN) 875-125 MG tablet  Every 12 hours        04/20/20 0006    meclizine (ANTIVERT) 25 MG tablet  3 times daily PRN        04/20/20 0006           Maxwell Caul, PA-C 04/20/20 0013    Sabas Sous, MD 04/20/20 [phone]

## 2020-04-19 NOTE — ED Notes (Signed)
Patient evaluated by Dr. Renato Gails who advised patient be seen at ED for possible insulin and fluids and monitoring

## 2020-04-19 NOTE — ED Triage Notes (Signed)
Patient arrives to ED with complaints of nausea, dizziness, diarrhea, and hyperglycemia x2 weeks. Pt states he is a type 1 diabetic. States recent polydipsia an polyuria. Denies pain. States recent ringing in his ear.

## 2020-04-20 MED ORDER — MECLIZINE HCL 25 MG PO TABS: 25.0000 mg | ORAL_TABLET | Freq: Three times a day (TID) | ORAL | 0 refills | Status: DC | PRN

## 2020-04-20 MED ORDER — AMOXICILLIN-POT CLAVULANATE 875-125 MG PO TABS: 1.0000 | ORAL_TABLET | Freq: Two times a day (BID) | ORAL | 0 refills | Status: DC

## 2020-04-20 NOTE — Discharge Instructions (Signed)
Take antibiotics as directed. Please take all of your antibiotics until finished.  Take meclizine for dizziness/nausea. Follow up with the ENT doctor.   Make sure you are compliant with your insulin medication.   Return to the Emergency Dept for any fever, vomiting, difficulty breathing, difficulty walking or any other worsening or concerning symptoms.

## 2020-06-10 ENCOUNTER — Other Ambulatory Visit (HOSPITAL_COMMUNITY): Payer: Self-pay | Admitting: Gastroenterology

## 2020-06-10 ENCOUNTER — Other Ambulatory Visit: Payer: Self-pay | Admitting: Gastroenterology

## 2020-06-10 DIAGNOSIS — K2100 Gastro-esophageal reflux disease with esophagitis, without bleeding: Secondary | ICD-10-CM

## 2020-06-10 DIAGNOSIS — R933 Abnormal findings on diagnostic imaging of other parts of digestive tract: Secondary | ICD-10-CM

## 2020-07-12 ENCOUNTER — Encounter (HOSPITAL_COMMUNITY): Payer: Self-pay

## 2020-07-12 ENCOUNTER — Ambulatory Visit (HOSPITAL_COMMUNITY): Payer: Commercial Managed Care - PPO | Attending: Gastroenterology

## 2020-08-27 DIAGNOSIS — M25561 Pain in right knee: Secondary | ICD-10-CM | POA: Insufficient documentation

## 2021-01-24 ENCOUNTER — Ambulatory Visit (INDEPENDENT_AMBULATORY_CARE_PROVIDER_SITE_OTHER): Payer: Commercial Managed Care - PPO | Admitting: Podiatry

## 2021-01-24 ENCOUNTER — Other Ambulatory Visit: Payer: Self-pay

## 2021-01-24 DIAGNOSIS — K581 Irritable bowel syndrome with constipation: Secondary | ICD-10-CM | POA: Insufficient documentation

## 2021-01-24 DIAGNOSIS — K2210 Ulcer of esophagus without bleeding: Secondary | ICD-10-CM | POA: Insufficient documentation

## 2021-01-24 DIAGNOSIS — M79675 Pain in left toe(s): Secondary | ICD-10-CM | POA: Diagnosis not present

## 2021-01-24 DIAGNOSIS — K2100 Gastro-esophageal reflux disease with esophagitis, without bleeding: Secondary | ICD-10-CM | POA: Insufficient documentation

## 2021-01-24 DIAGNOSIS — L600 Ingrowing nail: Secondary | ICD-10-CM

## 2021-01-24 DIAGNOSIS — R933 Abnormal findings on diagnostic imaging of other parts of digestive tract: Secondary | ICD-10-CM | POA: Insufficient documentation

## 2021-01-24 DIAGNOSIS — R1310 Dysphagia, unspecified: Secondary | ICD-10-CM | POA: Insufficient documentation

## 2021-01-24 NOTE — Progress Notes (Signed)
  Subjective:  Patient ID: Calvin Baxter, male    DOB: December 05, 1985,  MRN: [id number]  Chief Complaint  Patient presents with   Nail Problem    Rt hallux lateral border x 1 mo - no injury -1/10 pain - w/ redness, swelling and draiange tx: oTC topical cream    35 y.o. male presents with the above complaint. History confirmed with patient.   Objective:  Physical Exam: warm, good capillary refill, no trophic changes or ulcerative lesions, normal DP and PT pulses, and normal sensory exam.  Painful ingrowing nail at lateral border of the right, hallux; local warmth noted and local erythema noted  Assessment:   1. Ingrown toenail   2. Toe pain, left    Plan:  Patient was evaluated and treated and all questions answered.  Ingrown Nail, right -Patient elects to proceed with ingrown toenail removal today -Ingrown nail excised. See procedure note. -Educated on post-procedure care including soaking. Written instructions provided.  Procedure: Excision of Ingrown Toenail Location: Right 1st toe lateral border Anesthesia: Lidocaine 1% plain; 81mL, digital block. Skin Prep: Betadine. Dressing: Silvadene; telfa; dry, sterile, compression dressing. Technique: Following skin prep, the toe was exsanguinated and a tourniquet was secured at the base of the toe. The affected nail border was freed, split with a nail splitter, and excised. Chemical matrixectomy was then performed with phenol and irrigated out with alcohol. The tourniquet was then removed and sterile dressing applied. Disposition: Patient tolerated procedure well. Patient to return in 2 weeks for follow-up.  Return in about 2 weeks (around 02/07/2021) for Nail Check.

## 2021-01-24 NOTE — Patient Instructions (Signed)

## 2021-02-07 ENCOUNTER — Ambulatory Visit (INDEPENDENT_AMBULATORY_CARE_PROVIDER_SITE_OTHER): Payer: Commercial Managed Care - PPO | Admitting: Podiatry

## 2021-02-07 DIAGNOSIS — Z5329 Procedure and treatment not carried out because of patient's decision for other reasons: Secondary | ICD-10-CM

## 2021-02-15 ENCOUNTER — Other Ambulatory Visit: Payer: Self-pay | Admitting: Internal Medicine

## 2021-02-15 DIAGNOSIS — N611 Abscess of the breast and nipple: Secondary | ICD-10-CM

## 2021-02-23 ENCOUNTER — Ambulatory Visit
Admission: RE | Admit: 2021-02-23 | Discharge: 2021-02-23 | Disposition: A | Discharge status: Home / Self Care | Payer: Commercial Managed Care - PPO | Source: Ambulatory Visit | Attending: Internal Medicine | Admitting: Internal Medicine

## 2021-02-23 ENCOUNTER — Other Ambulatory Visit: Payer: Self-pay

## 2021-02-23 ENCOUNTER — Ambulatory Visit: Payer: Commercial Managed Care - PPO

## 2021-02-23 DIAGNOSIS — N611 Abscess of the breast and nipple: Secondary | ICD-10-CM

## 2021-06-29 DIAGNOSIS — F411 Generalized anxiety disorder: Secondary | ICD-10-CM | POA: Diagnosis not present

## 2021-07-06 ENCOUNTER — Other Ambulatory Visit: Payer: Self-pay | Admitting: Physician Assistant

## 2021-07-07 DIAGNOSIS — R0981 Nasal congestion: Secondary | ICD-10-CM | POA: Diagnosis not present

## 2021-07-07 DIAGNOSIS — U071 COVID-19: Secondary | ICD-10-CM | POA: Diagnosis not present

## 2021-07-07 DIAGNOSIS — J029 Acute pharyngitis, unspecified: Secondary | ICD-10-CM | POA: Diagnosis not present

## 2021-07-27 DIAGNOSIS — F411 Generalized anxiety disorder: Secondary | ICD-10-CM | POA: Diagnosis not present

## 2021-08-10 DIAGNOSIS — F411 Generalized anxiety disorder: Secondary | ICD-10-CM | POA: Diagnosis not present

## 2021-08-24 DIAGNOSIS — E1065 Type 1 diabetes mellitus with hyperglycemia: Secondary | ICD-10-CM | POA: Diagnosis not present

## 2021-08-24 DIAGNOSIS — F411 Generalized anxiety disorder: Secondary | ICD-10-CM | POA: Diagnosis not present

## 2021-09-05 DIAGNOSIS — E1065 Type 1 diabetes mellitus with hyperglycemia: Secondary | ICD-10-CM | POA: Diagnosis not present

## 2021-09-05 DIAGNOSIS — Z8619 Personal history of other infectious and parasitic diseases: Secondary | ICD-10-CM | POA: Diagnosis not present

## 2021-09-07 DIAGNOSIS — F411 Generalized anxiety disorder: Secondary | ICD-10-CM | POA: Diagnosis not present

## 2021-09-21 DIAGNOSIS — F411 Generalized anxiety disorder: Secondary | ICD-10-CM | POA: Diagnosis not present

## 2021-10-05 DIAGNOSIS — E1040 Type 1 diabetes mellitus with diabetic neuropathy, unspecified: Secondary | ICD-10-CM | POA: Diagnosis not present

## 2021-10-05 DIAGNOSIS — E10621 Type 1 diabetes mellitus with foot ulcer: Secondary | ICD-10-CM | POA: Diagnosis not present

## 2021-10-11 ENCOUNTER — Encounter: Payer: Self-pay | Admitting: Sports Medicine

## 2021-10-11 ENCOUNTER — Ambulatory Visit (INDEPENDENT_AMBULATORY_CARE_PROVIDER_SITE_OTHER): Payer: BC Managed Care – PPO

## 2021-10-11 ENCOUNTER — Ambulatory Visit (INDEPENDENT_AMBULATORY_CARE_PROVIDER_SITE_OTHER): Payer: BC Managed Care – PPO | Admitting: Sports Medicine

## 2021-10-11 ENCOUNTER — Other Ambulatory Visit: Payer: Self-pay | Admitting: *Deleted

## 2021-10-11 VITALS — Temp 97.6°F

## 2021-10-11 DIAGNOSIS — R197 Diarrhea, unspecified: Secondary | ICD-10-CM | POA: Insufficient documentation

## 2021-10-11 DIAGNOSIS — M79674 Pain in right toe(s): Secondary | ICD-10-CM | POA: Diagnosis not present

## 2021-10-11 DIAGNOSIS — L97519 Non-pressure chronic ulcer of other part of right foot with unspecified severity: Secondary | ICD-10-CM | POA: Diagnosis not present

## 2021-10-11 DIAGNOSIS — E1142 Type 2 diabetes mellitus with diabetic polyneuropathy: Secondary | ICD-10-CM | POA: Diagnosis not present

## 2021-10-11 DIAGNOSIS — L97512 Non-pressure chronic ulcer of other part of right foot with fat layer exposed: Secondary | ICD-10-CM | POA: Insufficient documentation

## 2021-10-11 DIAGNOSIS — Z8619 Personal history of other infectious and parasitic diseases: Secondary | ICD-10-CM | POA: Insufficient documentation

## 2021-10-11 DIAGNOSIS — L231 Allergic contact dermatitis due to adhesives: Secondary | ICD-10-CM | POA: Insufficient documentation

## 2021-10-11 NOTE — Addendum Note (Signed)
Addended by: Cranford Mon R on: 10/11/2021 01:03 PM ? ? Modules accepted: Orders ? ?

## 2021-10-11 NOTE — Progress Notes (Signed)
Subjective: ?Calvin Baxter is a 36 y.o. male patient seen in office for evaluation of ulceration of the right fourth toe.  Patient has a history of diabetes and a blood glucose level not recorded.  Patient is changing the dressing using mupirocin that he just got a few days ago from his PCP as well as was prescribed doxycycline 100 mg.  Denies nausea/fever/vomiting/chills/night sweats/shortness of breath/pain. Patient has no other pedal complaints at this time ? ?Patient has a history of neuropathy and reports that he cannot tell that there was a sore there until a few days ago and states that he also scraped the front of his legs coming up on the porch a few days ago as well. ? ?Patient Active Problem List  ? Diagnosis Date Noted  ? Contact dermatitis due to adhesive bandage 10/11/2021  ? Diarrhea 10/11/2021  ? History of infectious disease 10/11/2021  ? Non-pressure chronic ulcer of other part of right foot with fat layer exposed ([place]) 10/11/2021  ? Abnormal endoscopy of upper gastrointestinal tract 01/24/2021  ? Dysphagia 01/24/2021  ? Gastroesophageal reflux disease with esophagitis without hemorrhage 01/24/2021  ? Irritable bowel syndrome with constipation 01/24/2021  ? Ulcer of esophagus 01/24/2021  ? Right knee pain 08/27/2020  ? Diabetic peripheral neuropathy ([place]) 02/13/2020  ? Panic disorder 02/13/2020  ? Polyneuropathy 10/01/2019  ? Backache 09/11/2019  ? Noninfective gastroenteritis and colitis, unspecified 02/21/2019  ? Stress fracture, right foot, subsequent encounter for fracture with routine healing 06/16/2018  ? Long term (current) use of insulin ([place]) 04/10/2018  ? History of excision of intestinal structure 01/23/2018  ? Acute appendicitis, uncomplicated 02/77/4128  ? S/P laparoscopic appendectomy 12/26/2017  ? Leukocytosis 12/26/2017  ? Hyperlipidemia 12/19/2017  ? Nausea without vomiting 06/28/2017  ? Alcoholism ([place]) 06/28/2017  ? Type I diabetes mellitus with complication, uncontrolled  06/28/2017  ? Encounter for general adult medical examination without abnormal findings 11/14/2016  ? Male erectile disorder 05/06/2014  ? Dizziness 03/13/2014  ? Hyperglycemia due to type 1 diabetes mellitus ([place]) 01/27/2014  ? Allergic rhinitis 08/25/2009  ? ?Current Outpatient Medications on File Prior to Visit  ?Medication Sig Dispense Refill  ? BINAXNOW COVID-19 AG HOME TEST KIT     ? clonazePAM (KLONOPIN) 0.5 MG tablet Take 0.5 mg by mouth in the morning, at noon, and at bedtime.   0  ? Continuous Blood Gluc Sensor (FREESTYLE LIBRE 14 DAY SENSOR) MISC USE TO MONITOR CONTINUOUS BLOOD SUGAR. CHANGE EVERY 14 DAYS.  5  ? famotidine (PEPCID) 40 MG tablet Take 40 mg by mouth 2 (two) times daily.    ? fexofenadine (ALLEGRA) 180 MG tablet Take 180 mg by mouth daily.    ? gabapentin (NEURONTIN) 800 MG tablet Take 800 mg by mouth at bedtime.     ? gabapentin (NEURONTIN) 800 MG tablet gabapentin 800 mg tablet ? Take 2 tablets every day by oral route.    ? Ginger, Zingiber officinalis, (GINGER PO) Take 1 tablet by mouth daily.    ? insulin aspart protamine- aspart (NOVOLOG MIX 70/30) (70-30) 100 UNIT/ML injection Inject into the skin continuous. Use as directed in insulin pump, max 100 units/day     ? Insulin Disposable Pump MISC by Does not apply route.    ? mupirocin ointment (BACTROBAN) 2 % SMARTSIG:1 Application Topical 2-3 Times Daily    ? pantoprazole (PROTONIX) 20 MG tablet Take 20 mg by mouth daily.    ? valACYclovir (VALTREX) 1000 MG tablet valacyclovir 1 gram tablet ? prn    ?  Venlafaxine HCl 225 MG TB24 Take 1 tablet by mouth at bedtime.    ? Vilazodone HCl (VIIBRYD PO) Viibryd    ? ?No current facility-administered medications on file prior to visit.  ? ?No Known Allergies ? ?No results found for this or any previous visit (from the past 2160 hour(s)). ? ?Objective: ?There were no vitals filed for this visit. ? ?General: Patient is awake, alert, oriented x 3 and in no acute distress. ? ?Dermatology: Skin is  warm and dry bilateral with a partial thickness ulceration present right fourth toe dorsal aspect over the interphalangeal joint with a dry scab once debrided ulceration measures 0.2 cm x 0.2 cm x 0.1 cm. There is a  ?Keratotic border with a granular base. The ulceration does not  ?probe to bone. There is no malodor, no active drainage, minimal localized blanchable erythema, no edema. No acute signs of infection. ? ?Superficial abrasions noted to anterior shins with no surrounding signs of infection ?  ?Vascular: Dorsalis Pedis pulse = 2/4 Bilateral,  Posterior Tibial pulse = 1/4 Bilateral,  Capillary Fill Time < 5 seconds ? ?Neurologic: Protective sensation severely diminished bilateral history of neuropathy ? ?Musculosketal: There is no pain to palpation to ulcerated area at right fourth toe. ? ?Xrays, right foot: At right fourth toe no bony destruction suggestive of osteomyelitis. No gas in soft tissues.  ? ?No results for input(s): GRAMSTAIN, LABORGA in the last 8760 hours. ? ?Assessment and Plan:  ?Problem List Items Addressed This Visit   ?None ?Visit Diagnoses   ? ? Ulcer of right foot, unspecified ulcer stage ([place])    -  Primary  ? Relevant Orders  ? DG Foot Complete Right  ? Toe pain, right      ? Diabetic polyneuropathy associated with type 2 diabetes mellitus ([place])      ? ?  ? ? ? ?-Examined patient and discussed the progression of the wound and treatment alternatives. ?-Xrays reviewed ?- Excisionally debrided ulceration at right fourth toe to healthy bleeding borders removing nonviable tissue using a sterile chisel blade. Wound measures post debridement as above.  Wound was debrided to the level of the dermis with viable wound base exposed to promote healing. Hemostasis was achieved with manuel pressure. Patient tolerated procedure well without any discomfort or anesthesia necessary for this wound debridement.  ?-Applied antibiotic ointment and dry sterile dressing and instructed patient to continue with  daily dressings at home consisting of Bactroban and bandaid/dry sterile dressing. ?-Continue with doxycycline antibiotic as prescribed by PCP ?-Advised patient to wear shoe that does not rub toe patient declined surgical shoe at this time ?- Advised patient to go to the ER or return to office if the wound worsens or if constitutional symptoms are present. ?-Patient to return to office in 2-3 weeks for follow up wound care and evaluation or sooner if problems arise. ? ?Landis Martins, DPM ?

## 2021-10-12 DIAGNOSIS — Z794 Long term (current) use of insulin: Secondary | ICD-10-CM | POA: Diagnosis not present

## 2021-10-12 DIAGNOSIS — E109 Type 1 diabetes mellitus without complications: Secondary | ICD-10-CM | POA: Diagnosis not present

## 2021-10-12 DIAGNOSIS — E108 Type 1 diabetes mellitus with unspecified complications: Secondary | ICD-10-CM | POA: Diagnosis not present

## 2021-10-12 DIAGNOSIS — Z9641 Presence of insulin pump (external) (internal): Secondary | ICD-10-CM | POA: Diagnosis not present

## 2021-10-26 DIAGNOSIS — F411 Generalized anxiety disorder: Secondary | ICD-10-CM | POA: Diagnosis not present

## 2021-11-01 ENCOUNTER — Encounter: Payer: Self-pay | Admitting: Sports Medicine

## 2021-11-01 ENCOUNTER — Ambulatory Visit (INDEPENDENT_AMBULATORY_CARE_PROVIDER_SITE_OTHER): Payer: BC Managed Care – PPO | Admitting: Sports Medicine

## 2021-11-01 DIAGNOSIS — E1142 Type 2 diabetes mellitus with diabetic polyneuropathy: Secondary | ICD-10-CM | POA: Diagnosis not present

## 2021-11-01 DIAGNOSIS — M79674 Pain in right toe(s): Secondary | ICD-10-CM

## 2021-11-01 DIAGNOSIS — L97519 Non-pressure chronic ulcer of other part of right foot with unspecified severity: Secondary | ICD-10-CM | POA: Diagnosis not present

## 2021-11-01 NOTE — Progress Notes (Signed)
Subjective: ?Calvin Baxter is a 36 y.o. male patient seen in office for follow up evaluation of ulceration of the right fourth toe.  States that the toe is still red and states that he did not take all his antibiotics because he wasn't sure. Patient has a history of diabetes and a blood glucose level not recorded but has been high. Patient has no other pedal complaints at this time. ? ?Patient Active Problem List  ? Diagnosis Date Noted  ? Contact dermatitis due to adhesive bandage 10/11/2021  ? Diarrhea 10/11/2021  ? History of infectious disease 10/11/2021  ? Non-pressure chronic ulcer of other part of right foot with fat layer exposed ([place]) 10/11/2021  ? Abnormal endoscopy of upper gastrointestinal tract 01/24/2021  ? Dysphagia 01/24/2021  ? Gastroesophageal reflux disease with esophagitis without hemorrhage 01/24/2021  ? Irritable bowel syndrome with constipation 01/24/2021  ? Ulcer of esophagus 01/24/2021  ? Right knee pain 08/27/2020  ? Diabetic peripheral neuropathy ([place]) 02/13/2020  ? Panic disorder 02/13/2020  ? Polyneuropathy 10/01/2019  ? Backache 09/11/2019  ? Noninfective gastroenteritis and colitis, unspecified 02/21/2019  ? Stress fracture, right foot, subsequent encounter for fracture with routine healing 06/16/2018  ? Long term (current) use of insulin ([place]) 04/10/2018  ? History of excision of intestinal structure 01/23/2018  ? Acute appendicitis, uncomplicated 14/97/0263  ? S/P laparoscopic appendectomy 12/26/2017  ? Leukocytosis 12/26/2017  ? Hyperlipidemia 12/19/2017  ? Nausea without vomiting 06/28/2017  ? Alcoholism ([place]) 06/28/2017  ? Type I diabetes mellitus with complication, uncontrolled 06/28/2017  ? Encounter for general adult medical examination without abnormal findings 11/14/2016  ? Male erectile disorder 05/06/2014  ? Dizziness 03/13/2014  ? Hyperglycemia due to type 1 diabetes mellitus ([place] Baxter) 01/27/2014  ? Allergic rhinitis 08/25/2009  ? ?Current Outpatient Medications on File Prior  to Visit  ?Medication Sig Dispense Refill  ? amoxicillin (AMOXIL) 500 MG tablet Take 500 mg by mouth 3 (three) times daily.    ? azithromycin (ZITHROMAX) 250 MG tablet Take by mouth.    ? BINAXNOW COVID-19 AG HOME TEST KIT     ? gabapentin (NEURONTIN) 800 MG tablet Take 800 mg by mouth at bedtime.     ? gabapentin (NEURONTIN) 800 MG tablet gabapentin 800 mg tablet ? Take 2 tablets every day by oral route.    ? insulin aspart protamine- aspart (NOVOLOG MIX 70/30) (70-30) 100 UNIT/ML injection Inject into the skin.    ? ketoconazole (NIZORAL) 2 % shampoo use 3 times a week    ? LAGEVRIO 200 MG CAPS capsule SMARTSIG:4 Capsule(s) By Mouth Every 12 Hours    ? Venlafaxine HCl 225 MG TB24 Take 1 tablet by mouth at bedtime.    ? ?No current facility-administered medications on file prior to visit.  ? ?No Known Allergies ? ?No results found for this or any previous visit (from the past 2160 hour(s)). ? ?Objective: ?There were no vitals filed for this visit. ? ?General: Patient is awake, alert, oriented x 3 and in no acute distress. ? ?Dermatology: Skin is warm and dry bilateral with a partial thickness ulceration present right fourth toe dorsal aspect over the interphalangeal joint with a dry scab once debrided ulceration measures 0.1 cm x 0.1 cm x 0.1 cm pinpoint and appears to be healing well. There is a  ?Keratotic border with a granular base. The ulceration does not  ?probe to bone. There is no malodor, no active drainage, minimal localized blanchable erythema, no edema. No acute signs of infection. ? ?  Superficial abrasions noted to anterior shins with no surrounding signs of infection ?  ?Vascular: Dorsalis Pedis pulse = 2/4 Bilateral,  Posterior Tibial pulse = 1/4 Bilateral,  Capillary Fill Time < 5 seconds ? ?Neurologic: Protective sensation severely diminished bilateral history of neuropathy ? ?Musculosketal: There is no pain to palpation to ulcerated area at right fourth toe. ? ? ?No results for input(s): GRAMSTAIN,  LABORGA in the last 8760 hours. ? ?Assessment and Plan:  ?Problem List Items Addressed This Visit   ?None ?Visit Diagnoses   ? ? Ulcer of right foot, unspecified ulcer stage ([place])    -  Primary  ? Toe pain, right      ? Diabetic polyneuropathy associated with type 2 diabetes mellitus ([place] Baxter)      ? ?  ? ? ? ?-Examined patient and discussed the progression of the wound and treatment alternatives. ?- Excisionally debrided ulceration at right fourth toe to healthy bleeding borders removing nonviable tissue using a sterile chisel blade. Wound measures post debridement as above.  Wound was debrided to the level of the dermis with viable wound base exposed to promote healing. Hemostasis was achieved with manuel pressure. Patient tolerated procedure well without any discomfort or anesthesia necessary for this wound debridement.  ?-Applied medihoney and dry sterile dressing and instructed patient to continue with daily dressings consisting of the same for 1 week or until healed. D/c bactroban at this time. ?-Advised patient to finish his doxcycline  ?-Advised patient to continue with shoe that does not rub the toe ?-Continue with close monitoring of blood sugars ?- Advised patient to go to the ER or return to office if the wound worsens or if constitutional symptoms are present. ?-Patient to return to office in 2-3 weeks for follow up wound care and evaluation or sooner if problems arise. If no better ? ?Landis Martins, DPM ?

## 2021-11-18 ENCOUNTER — Ambulatory Visit: Payer: BC Managed Care – PPO | Admitting: Sports Medicine

## 2021-11-18 DIAGNOSIS — F422 Mixed obsessional thoughts and acts: Secondary | ICD-10-CM | POA: Diagnosis not present

## 2021-11-18 DIAGNOSIS — F410 Panic disorder [episodic paroxysmal anxiety] without agoraphobia: Secondary | ICD-10-CM | POA: Diagnosis not present

## 2021-11-19 DIAGNOSIS — Z794 Long term (current) use of insulin: Secondary | ICD-10-CM | POA: Diagnosis not present

## 2021-11-19 DIAGNOSIS — E108 Type 1 diabetes mellitus with unspecified complications: Secondary | ICD-10-CM | POA: Diagnosis not present

## 2021-11-19 DIAGNOSIS — E109 Type 1 diabetes mellitus without complications: Secondary | ICD-10-CM | POA: Diagnosis not present

## 2021-11-19 DIAGNOSIS — Z9641 Presence of insulin pump (external) (internal): Secondary | ICD-10-CM | POA: Diagnosis not present

## 2021-11-24 DIAGNOSIS — M65351 Trigger finger, right little finger: Secondary | ICD-10-CM | POA: Diagnosis not present

## 2021-11-29 DIAGNOSIS — M79641 Pain in right hand: Secondary | ICD-10-CM | POA: Diagnosis not present

## 2021-11-29 DIAGNOSIS — M65351 Trigger finger, right little finger: Secondary | ICD-10-CM | POA: Diagnosis not present

## 2021-12-19 DIAGNOSIS — Z0000 Encounter for general adult medical examination without abnormal findings: Secondary | ICD-10-CM | POA: Diagnosis not present

## 2021-12-19 DIAGNOSIS — E785 Hyperlipidemia, unspecified: Secondary | ICD-10-CM | POA: Diagnosis not present

## 2021-12-19 DIAGNOSIS — E1040 Type 1 diabetes mellitus with diabetic neuropathy, unspecified: Secondary | ICD-10-CM | POA: Diagnosis not present

## 2021-12-19 DIAGNOSIS — Z1331 Encounter for screening for depression: Secondary | ICD-10-CM | POA: Diagnosis not present

## 2021-12-19 DIAGNOSIS — R739 Hyperglycemia, unspecified: Secondary | ICD-10-CM | POA: Diagnosis not present

## 2021-12-19 DIAGNOSIS — E1065 Type 1 diabetes mellitus with hyperglycemia: Secondary | ICD-10-CM | POA: Diagnosis not present

## 2021-12-21 DIAGNOSIS — F411 Generalized anxiety disorder: Secondary | ICD-10-CM | POA: Diagnosis not present

## 2022-01-09 DIAGNOSIS — F411 Generalized anxiety disorder: Secondary | ICD-10-CM | POA: Diagnosis not present

## 2022-01-27 DIAGNOSIS — J029 Acute pharyngitis, unspecified: Secondary | ICD-10-CM | POA: Diagnosis not present

## 2022-02-11 DIAGNOSIS — Z9641 Presence of insulin pump (external) (internal): Secondary | ICD-10-CM | POA: Diagnosis not present

## 2022-02-11 DIAGNOSIS — E109 Type 1 diabetes mellitus without complications: Secondary | ICD-10-CM | POA: Diagnosis not present

## 2022-02-11 DIAGNOSIS — Z794 Long term (current) use of insulin: Secondary | ICD-10-CM | POA: Diagnosis not present

## 2022-02-11 DIAGNOSIS — E108 Type 1 diabetes mellitus with unspecified complications: Secondary | ICD-10-CM | POA: Diagnosis not present

## 2022-02-13 DIAGNOSIS — E1065 Type 1 diabetes mellitus with hyperglycemia: Secondary | ICD-10-CM | POA: Diagnosis not present

## 2022-03-03 DIAGNOSIS — Z9641 Presence of insulin pump (external) (internal): Secondary | ICD-10-CM | POA: Diagnosis not present

## 2022-03-03 DIAGNOSIS — Z794 Long term (current) use of insulin: Secondary | ICD-10-CM | POA: Diagnosis not present

## 2022-03-03 DIAGNOSIS — E108 Type 1 diabetes mellitus with unspecified complications: Secondary | ICD-10-CM | POA: Diagnosis not present

## 2022-03-03 DIAGNOSIS — E109 Type 1 diabetes mellitus without complications: Secondary | ICD-10-CM | POA: Diagnosis not present

## 2022-03-09 DIAGNOSIS — F411 Generalized anxiety disorder: Secondary | ICD-10-CM | POA: Diagnosis not present

## 2022-03-22 DIAGNOSIS — G629 Polyneuropathy, unspecified: Secondary | ICD-10-CM | POA: Diagnosis not present

## 2022-03-22 DIAGNOSIS — E1040 Type 1 diabetes mellitus with diabetic neuropathy, unspecified: Secondary | ICD-10-CM | POA: Diagnosis not present

## 2022-04-05 DIAGNOSIS — F411 Generalized anxiety disorder: Secondary | ICD-10-CM | POA: Diagnosis not present

## 2022-04-08 DIAGNOSIS — Z794 Long term (current) use of insulin: Secondary | ICD-10-CM | POA: Diagnosis not present

## 2022-04-08 DIAGNOSIS — Z9641 Presence of insulin pump (external) (internal): Secondary | ICD-10-CM | POA: Diagnosis not present

## 2022-04-08 DIAGNOSIS — E109 Type 1 diabetes mellitus without complications: Secondary | ICD-10-CM | POA: Diagnosis not present

## 2022-04-08 DIAGNOSIS — E108 Type 1 diabetes mellitus with unspecified complications: Secondary | ICD-10-CM | POA: Diagnosis not present

## 2022-04-14 DIAGNOSIS — F5221 Male erectile disorder: Secondary | ICD-10-CM | POA: Diagnosis not present

## 2022-04-14 DIAGNOSIS — E1065 Type 1 diabetes mellitus with hyperglycemia: Secondary | ICD-10-CM | POA: Diagnosis not present

## 2022-04-14 DIAGNOSIS — R5383 Other fatigue: Secondary | ICD-10-CM | POA: Diagnosis not present

## 2022-05-02 DIAGNOSIS — M542 Cervicalgia: Secondary | ICD-10-CM | POA: Diagnosis not present

## 2022-05-02 DIAGNOSIS — M79645 Pain in left finger(s): Secondary | ICD-10-CM | POA: Diagnosis not present

## 2022-05-02 DIAGNOSIS — M65352 Trigger finger, left little finger: Secondary | ICD-10-CM | POA: Diagnosis not present

## 2022-05-23 DIAGNOSIS — R0681 Apnea, not elsewhere classified: Secondary | ICD-10-CM | POA: Diagnosis not present

## 2022-05-24 DIAGNOSIS — R0681 Apnea, not elsewhere classified: Secondary | ICD-10-CM | POA: Diagnosis not present

## 2022-05-29 DIAGNOSIS — G4733 Obstructive sleep apnea (adult) (pediatric): Secondary | ICD-10-CM | POA: Diagnosis not present

## 2022-06-08 DIAGNOSIS — F411 Generalized anxiety disorder: Secondary | ICD-10-CM | POA: Diagnosis not present

## 2022-06-14 DIAGNOSIS — G4733 Obstructive sleep apnea (adult) (pediatric): Secondary | ICD-10-CM | POA: Diagnosis not present

## 2022-06-14 DIAGNOSIS — F411 Generalized anxiety disorder: Secondary | ICD-10-CM | POA: Diagnosis not present

## 2022-06-14 DIAGNOSIS — Z794 Long term (current) use of insulin: Secondary | ICD-10-CM | POA: Diagnosis not present

## 2022-06-14 DIAGNOSIS — Z9641 Presence of insulin pump (external) (internal): Secondary | ICD-10-CM | POA: Diagnosis not present

## 2022-06-14 DIAGNOSIS — E108 Type 1 diabetes mellitus with unspecified complications: Secondary | ICD-10-CM | POA: Diagnosis not present

## 2022-06-14 DIAGNOSIS — E109 Type 1 diabetes mellitus without complications: Secondary | ICD-10-CM | POA: Diagnosis not present

## 2022-07-11 DIAGNOSIS — F411 Generalized anxiety disorder: Secondary | ICD-10-CM | POA: Diagnosis not present

## 2022-07-14 DIAGNOSIS — M79672 Pain in left foot: Secondary | ICD-10-CM | POA: Diagnosis not present

## 2022-07-14 DIAGNOSIS — M79671 Pain in right foot: Secondary | ICD-10-CM | POA: Diagnosis not present

## 2022-07-15 DIAGNOSIS — G4733 Obstructive sleep apnea (adult) (pediatric): Secondary | ICD-10-CM | POA: Diagnosis not present

## 2022-08-09 DIAGNOSIS — Z5181 Encounter for therapeutic drug level monitoring: Secondary | ICD-10-CM | POA: Diagnosis not present

## 2022-08-09 DIAGNOSIS — M792 Neuralgia and neuritis, unspecified: Secondary | ICD-10-CM | POA: Diagnosis not present

## 2022-08-09 DIAGNOSIS — Z79899 Other long term (current) drug therapy: Secondary | ICD-10-CM | POA: Diagnosis not present

## 2022-08-09 DIAGNOSIS — G894 Chronic pain syndrome: Secondary | ICD-10-CM | POA: Diagnosis not present

## 2022-08-10 DIAGNOSIS — E103493 Type 1 diabetes mellitus with severe nonproliferative diabetic retinopathy without macular edema, bilateral: Secondary | ICD-10-CM | POA: Diagnosis not present

## 2022-08-15 DIAGNOSIS — G4733 Obstructive sleep apnea (adult) (pediatric): Secondary | ICD-10-CM | POA: Diagnosis not present

## 2022-08-23 DIAGNOSIS — G629 Polyneuropathy, unspecified: Secondary | ICD-10-CM | POA: Diagnosis not present

## 2022-08-24 DIAGNOSIS — H3582 Retinal ischemia: Secondary | ICD-10-CM | POA: Diagnosis not present

## 2022-08-24 DIAGNOSIS — H3562 Retinal hemorrhage, left eye: Secondary | ICD-10-CM | POA: Diagnosis not present

## 2022-08-24 DIAGNOSIS — E113513 Type 2 diabetes mellitus with proliferative diabetic retinopathy with macular edema, bilateral: Secondary | ICD-10-CM | POA: Diagnosis not present

## 2022-08-24 DIAGNOSIS — H3509 Other intraretinal microvascular abnormalities: Secondary | ICD-10-CM | POA: Diagnosis not present

## 2022-08-31 DIAGNOSIS — E103511 Type 1 diabetes mellitus with proliferative diabetic retinopathy with macular edema, right eye: Secondary | ICD-10-CM | POA: Diagnosis not present

## 2022-08-31 DIAGNOSIS — G4733 Obstructive sleep apnea (adult) (pediatric): Secondary | ICD-10-CM | POA: Diagnosis not present

## 2022-09-06 DIAGNOSIS — Z794 Long term (current) use of insulin: Secondary | ICD-10-CM | POA: Diagnosis not present

## 2022-09-06 DIAGNOSIS — Z9641 Presence of insulin pump (external) (internal): Secondary | ICD-10-CM | POA: Diagnosis not present

## 2022-09-06 DIAGNOSIS — E109 Type 1 diabetes mellitus without complications: Secondary | ICD-10-CM | POA: Diagnosis not present

## 2022-09-06 DIAGNOSIS — E108 Type 1 diabetes mellitus with unspecified complications: Secondary | ICD-10-CM | POA: Diagnosis not present

## 2022-09-07 DIAGNOSIS — E103512 Type 1 diabetes mellitus with proliferative diabetic retinopathy with macular edema, left eye: Secondary | ICD-10-CM | POA: Diagnosis not present

## 2022-09-13 DIAGNOSIS — G4733 Obstructive sleep apnea (adult) (pediatric): Secondary | ICD-10-CM | POA: Diagnosis not present

## 2022-09-19 DIAGNOSIS — M65351 Trigger finger, right little finger: Secondary | ICD-10-CM | POA: Diagnosis not present

## 2022-09-26 DIAGNOSIS — E1040 Type 1 diabetes mellitus with diabetic neuropathy, unspecified: Secondary | ICD-10-CM | POA: Diagnosis not present

## 2022-09-26 DIAGNOSIS — E785 Hyperlipidemia, unspecified: Secondary | ICD-10-CM | POA: Diagnosis not present

## 2022-09-26 DIAGNOSIS — D72829 Elevated white blood cell count, unspecified: Secondary | ICD-10-CM | POA: Diagnosis not present

## 2022-09-26 DIAGNOSIS — E1065 Type 1 diabetes mellitus with hyperglycemia: Secondary | ICD-10-CM | POA: Diagnosis not present

## 2022-09-28 DIAGNOSIS — M65351 Trigger finger, right little finger: Secondary | ICD-10-CM | POA: Diagnosis not present

## 2022-10-09 DIAGNOSIS — E103511 Type 1 diabetes mellitus with proliferative diabetic retinopathy with macular edema, right eye: Secondary | ICD-10-CM | POA: Diagnosis not present

## 2022-10-10 DIAGNOSIS — M65351 Trigger finger, right little finger: Secondary | ICD-10-CM | POA: Diagnosis not present

## 2022-10-14 DIAGNOSIS — G4733 Obstructive sleep apnea (adult) (pediatric): Secondary | ICD-10-CM | POA: Diagnosis not present

## 2022-10-20 DIAGNOSIS — M65351 Trigger finger, right little finger: Secondary | ICD-10-CM | POA: Diagnosis not present

## 2022-10-20 DIAGNOSIS — E103512 Type 1 diabetes mellitus with proliferative diabetic retinopathy with macular edema, left eye: Secondary | ICD-10-CM | POA: Diagnosis not present

## 2022-11-08 DIAGNOSIS — Z9641 Presence of insulin pump (external) (internal): Secondary | ICD-10-CM | POA: Diagnosis not present

## 2022-11-08 DIAGNOSIS — E108 Type 1 diabetes mellitus with unspecified complications: Secondary | ICD-10-CM | POA: Diagnosis not present

## 2022-11-08 DIAGNOSIS — E109 Type 1 diabetes mellitus without complications: Secondary | ICD-10-CM | POA: Diagnosis not present

## 2022-11-08 DIAGNOSIS — Z794 Long term (current) use of insulin: Secondary | ICD-10-CM | POA: Diagnosis not present

## 2022-11-13 DIAGNOSIS — G4733 Obstructive sleep apnea (adult) (pediatric): Secondary | ICD-10-CM | POA: Diagnosis not present

## 2022-11-14 DIAGNOSIS — H3582 Retinal ischemia: Secondary | ICD-10-CM | POA: Diagnosis not present

## 2022-11-14 DIAGNOSIS — H3509 Other intraretinal microvascular abnormalities: Secondary | ICD-10-CM | POA: Diagnosis not present

## 2022-11-14 DIAGNOSIS — E103513 Type 1 diabetes mellitus with proliferative diabetic retinopathy with macular edema, bilateral: Secondary | ICD-10-CM | POA: Diagnosis not present

## 2022-11-14 DIAGNOSIS — H35033 Hypertensive retinopathy, bilateral: Secondary | ICD-10-CM | POA: Diagnosis not present

## 2022-11-21 DIAGNOSIS — F4322 Adjustment disorder with anxiety: Secondary | ICD-10-CM | POA: Diagnosis not present

## 2022-11-21 DIAGNOSIS — F410 Panic disorder [episodic paroxysmal anxiety] without agoraphobia: Secondary | ICD-10-CM | POA: Diagnosis not present

## 2022-11-21 DIAGNOSIS — F422 Mixed obsessional thoughts and acts: Secondary | ICD-10-CM | POA: Diagnosis not present

## 2022-11-21 NOTE — Telephone Encounter (Signed)
I do not see this patient in [facility]. Does not appear to be under [facility]. Please let me know if further assistance needed.

## 2022-11-22 DIAGNOSIS — E103512 Type 1 diabetes mellitus with proliferative diabetic retinopathy with macular edema, left eye: Secondary | ICD-10-CM | POA: Diagnosis not present

## 2022-11-22 DIAGNOSIS — M65351 Trigger finger, right little finger: Secondary | ICD-10-CM | POA: Diagnosis not present

## 2022-11-27 DIAGNOSIS — G4733 Obstructive sleep apnea (adult) (pediatric): Secondary | ICD-10-CM | POA: Diagnosis not present

## 2022-11-27 DIAGNOSIS — E1040 Type 1 diabetes mellitus with diabetic neuropathy, unspecified: Secondary | ICD-10-CM | POA: Diagnosis not present

## 2022-11-28 DIAGNOSIS — F411 Generalized anxiety disorder: Secondary | ICD-10-CM | POA: Diagnosis not present

## 2022-11-28 DIAGNOSIS — E103511 Type 1 diabetes mellitus with proliferative diabetic retinopathy with macular edema, right eye: Secondary | ICD-10-CM | POA: Diagnosis not present

## 2022-12-14 DIAGNOSIS — G4733 Obstructive sleep apnea (adult) (pediatric): Secondary | ICD-10-CM | POA: Diagnosis not present

## 2022-12-21 DIAGNOSIS — E108 Type 1 diabetes mellitus with unspecified complications: Secondary | ICD-10-CM | POA: Diagnosis not present

## 2022-12-21 DIAGNOSIS — F411 Generalized anxiety disorder: Secondary | ICD-10-CM | POA: Diagnosis not present

## 2022-12-21 DIAGNOSIS — Z794 Long term (current) use of insulin: Secondary | ICD-10-CM | POA: Diagnosis not present

## 2022-12-21 DIAGNOSIS — Z9641 Presence of insulin pump (external) (internal): Secondary | ICD-10-CM | POA: Diagnosis not present

## 2022-12-21 DIAGNOSIS — E103512 Type 1 diabetes mellitus with proliferative diabetic retinopathy with macular edema, left eye: Secondary | ICD-10-CM | POA: Diagnosis not present

## 2022-12-21 DIAGNOSIS — E109 Type 1 diabetes mellitus without complications: Secondary | ICD-10-CM | POA: Diagnosis not present

## 2022-12-22 DIAGNOSIS — Z9641 Presence of insulin pump (external) (internal): Secondary | ICD-10-CM | POA: Diagnosis not present

## 2022-12-22 DIAGNOSIS — Z794 Long term (current) use of insulin: Secondary | ICD-10-CM | POA: Diagnosis not present

## 2022-12-22 DIAGNOSIS — E109 Type 1 diabetes mellitus without complications: Secondary | ICD-10-CM | POA: Diagnosis not present

## 2022-12-22 DIAGNOSIS — E108 Type 1 diabetes mellitus with unspecified complications: Secondary | ICD-10-CM | POA: Diagnosis not present

## 2023-01-04 IMAGING — MG DIGITAL DIAGNOSTIC BILAT W/ TOMO W/ CAD
6 of 10 series · 6 of 30 positions shown · non-contrast
Comparison: None.

ACR Breast Density Category a: The breast tissue is almost entirely
fatty.

CLINICAL DATA: Right nipple pain. Palpable abnormality at the
periareolar 12 o'clock position. Concern for right breast abscess;
patient has completed a course of antibiotics without resolution the
palpable abnormality but with resolution of right nipple pain.

EXAM:
DIGITAL DIAGNOSTIC BILATERAL MAMMOGRAM WITH TOMOSYNTHESIS AND CAD;
ULTRASOUND RIGHT BREAST LIMITED
TECHNIQUE: Bilateral digital diagnostic mammography and breast tomosynthesis
was performed. The images were evaluated with computer-aided
detection.; Targeted ultrasound examination of the right breast was
performed

[L CC synth-2D]
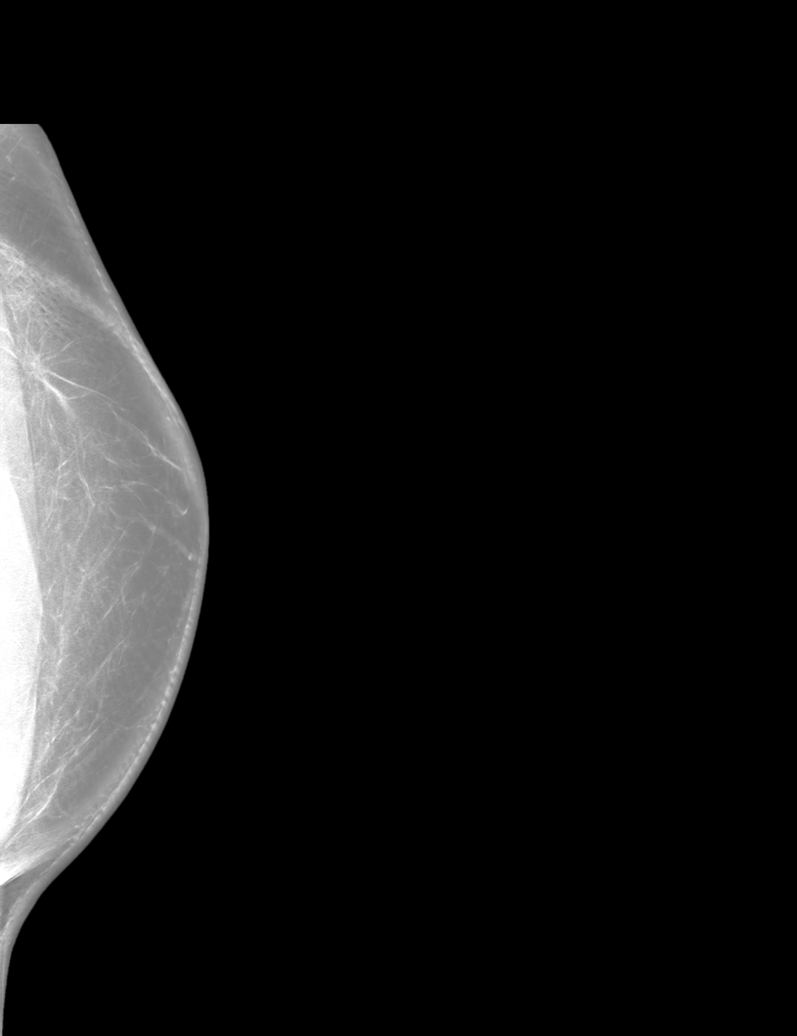

[R CC synth-2D]
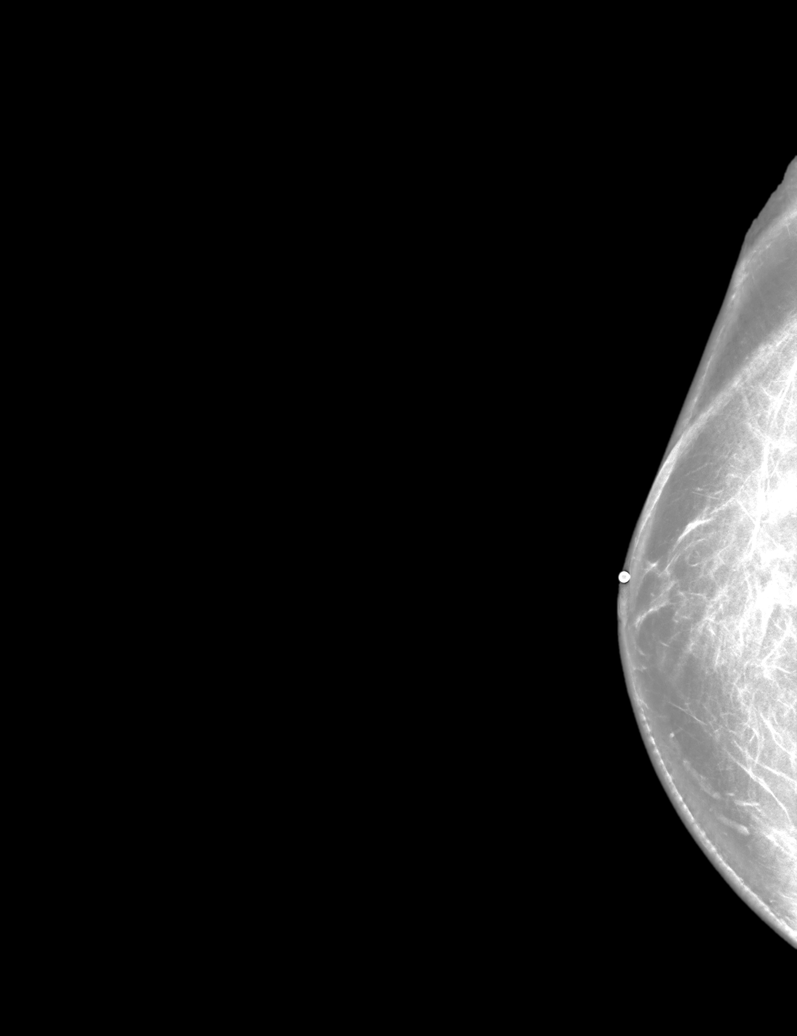

[L MLO synth-2D]
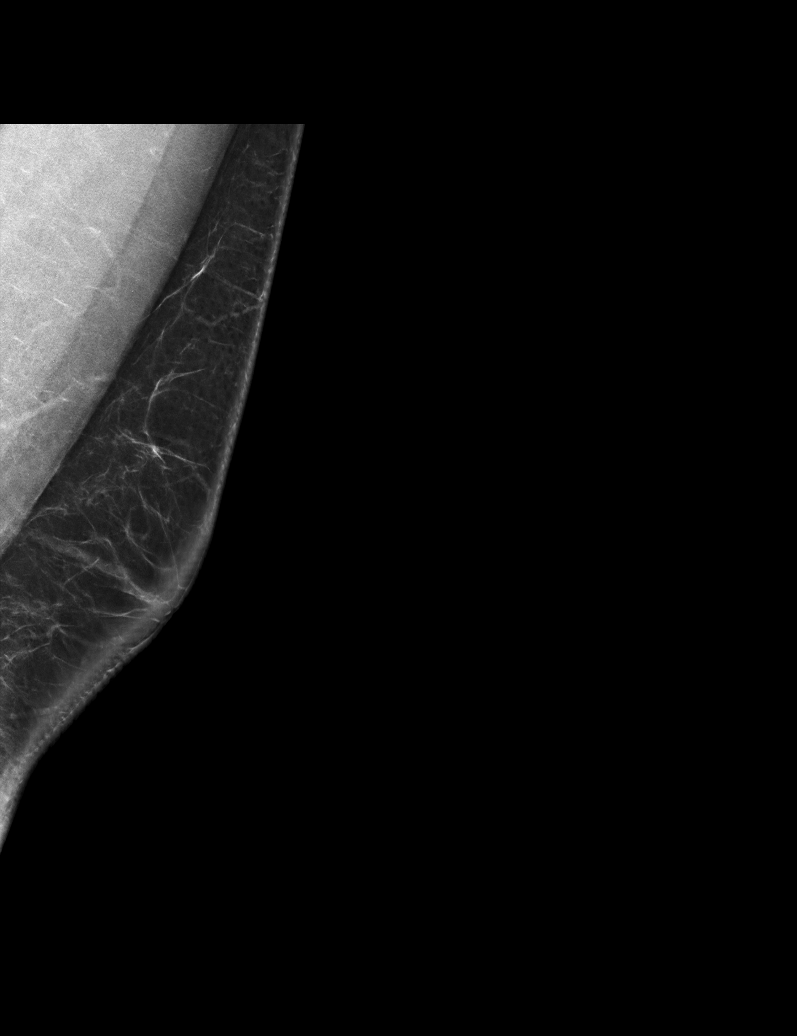

[R TAN synth-2D]
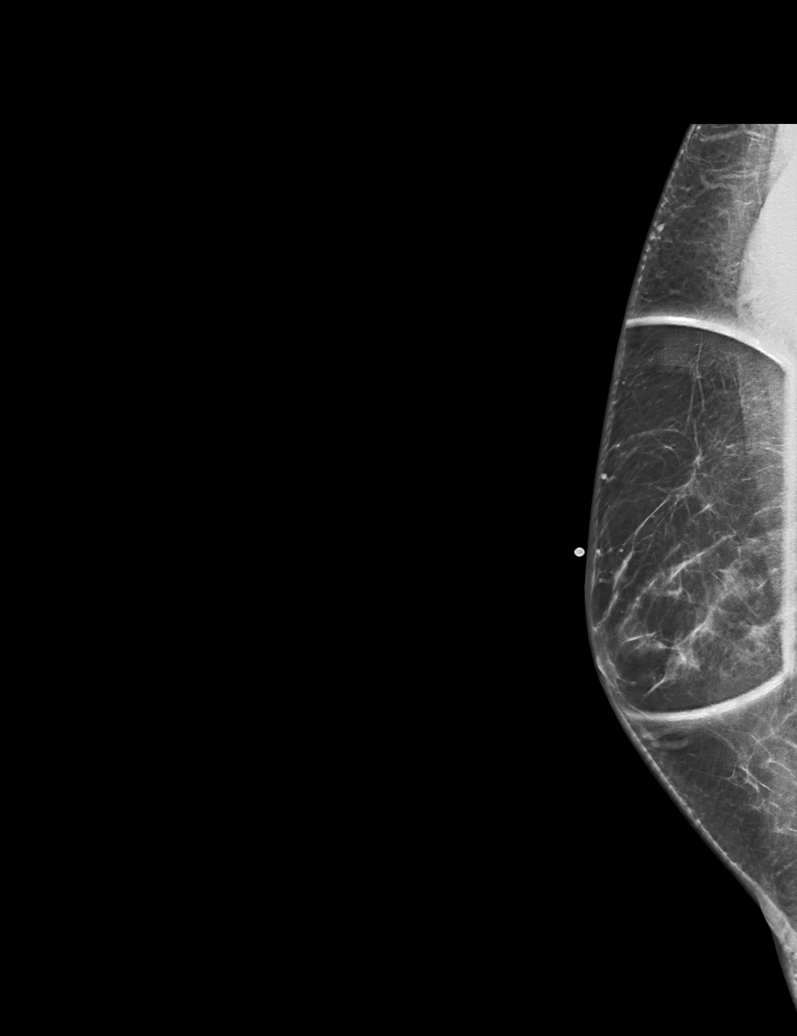

[R MLO synth-2D]
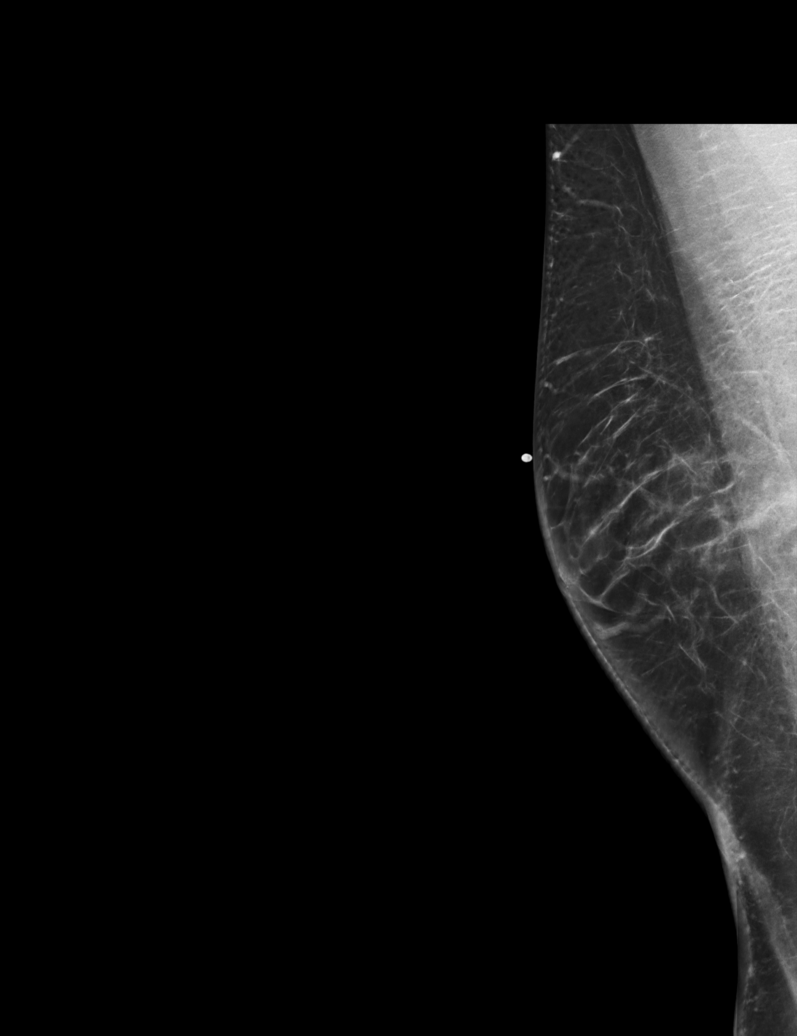

[L CC tomo · tomo slice 43/86.0]
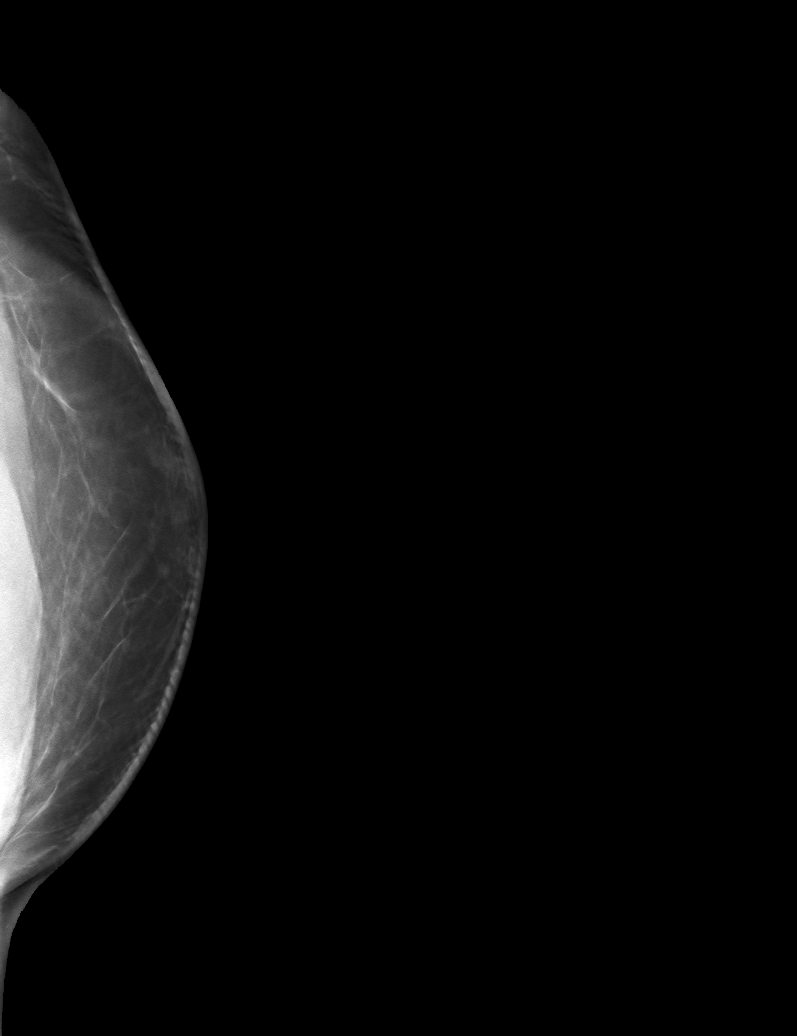

[6 of 30 positions shown; findings below may reference images not displayed]

FINDINGS: A metallic BB marker applied to the skin denotes the patient
indicated area of palpable abnormality at the periareolar right
breast 12 o'clock position. No underlying mammographic abnormality.
No suspicious masses, calcifications or other findings in either
breast.

On physical examination at the site of palpable abnormality, I do
not appreciate a discrete mass.

Targeted ultrasound of the retroareolar right breast and area of
palpable abnormality at the 12 o'clock position 1 cm nipple was
performed by the physician. No fluid collection. No suspicious solid
or cystic mass.
IMPRESSION: 1. No mammographic or sonographic findings of abscess or malignancy
in right breast.
2. No mammographic findings of malignancy in the left breast.

RECOMMENDATION:
Clinical follow-up.

I have discussed the findings and recommendations with the patient.
If applicable, a reminder letter will be sent to the patient
regarding the next appointment.

BI-RADS CATEGORY  1: Negative.

## 2023-01-05 DIAGNOSIS — E103511 Type 1 diabetes mellitus with proliferative diabetic retinopathy with macular edema, right eye: Secondary | ICD-10-CM | POA: Diagnosis not present

## 2023-01-13 DIAGNOSIS — G4733 Obstructive sleep apnea (adult) (pediatric): Secondary | ICD-10-CM | POA: Diagnosis not present

## 2023-01-18 DIAGNOSIS — F422 Mixed obsessional thoughts and acts: Secondary | ICD-10-CM | POA: Diagnosis not present

## 2023-01-18 DIAGNOSIS — F411 Generalized anxiety disorder: Secondary | ICD-10-CM | POA: Diagnosis not present

## 2023-01-18 DIAGNOSIS — F410 Panic disorder [episodic paroxysmal anxiety] without agoraphobia: Secondary | ICD-10-CM | POA: Diagnosis not present

## 2023-01-25 DIAGNOSIS — E103512 Type 1 diabetes mellitus with proliferative diabetic retinopathy with macular edema, left eye: Secondary | ICD-10-CM | POA: Diagnosis not present

## 2023-01-26 DIAGNOSIS — E103511 Type 1 diabetes mellitus with proliferative diabetic retinopathy with macular edema, right eye: Secondary | ICD-10-CM | POA: Diagnosis not present

## 2023-01-29 DIAGNOSIS — Z23 Encounter for immunization: Secondary | ICD-10-CM | POA: Diagnosis not present

## 2023-01-29 DIAGNOSIS — Z0000 Encounter for general adult medical examination without abnormal findings: Secondary | ICD-10-CM | POA: Diagnosis not present

## 2023-01-29 DIAGNOSIS — E1065 Type 1 diabetes mellitus with hyperglycemia: Secondary | ICD-10-CM | POA: Diagnosis not present

## 2023-01-29 DIAGNOSIS — E785 Hyperlipidemia, unspecified: Secondary | ICD-10-CM | POA: Diagnosis not present

## 2023-01-29 DIAGNOSIS — E1040 Type 1 diabetes mellitus with diabetic neuropathy, unspecified: Secondary | ICD-10-CM | POA: Diagnosis not present

## 2023-01-29 DIAGNOSIS — Z1331 Encounter for screening for depression: Secondary | ICD-10-CM | POA: Diagnosis not present

## 2023-01-29 DIAGNOSIS — R82998 Other abnormal findings in urine: Secondary | ICD-10-CM | POA: Diagnosis not present

## 2023-02-13 DIAGNOSIS — G4733 Obstructive sleep apnea (adult) (pediatric): Secondary | ICD-10-CM | POA: Diagnosis not present

## 2023-02-22 DIAGNOSIS — E103511 Type 1 diabetes mellitus with proliferative diabetic retinopathy with macular edema, right eye: Secondary | ICD-10-CM | POA: Diagnosis not present

## 2023-03-07 DIAGNOSIS — R197 Diarrhea, unspecified: Secondary | ICD-10-CM | POA: Diagnosis not present

## 2023-03-09 DIAGNOSIS — R197 Diarrhea, unspecified: Secondary | ICD-10-CM | POA: Diagnosis not present

## 2023-03-13 DIAGNOSIS — F411 Generalized anxiety disorder: Secondary | ICD-10-CM | POA: Diagnosis not present

## 2023-03-13 DIAGNOSIS — F422 Mixed obsessional thoughts and acts: Secondary | ICD-10-CM | POA: Diagnosis not present

## 2023-03-13 DIAGNOSIS — F410 Panic disorder [episodic paroxysmal anxiety] without agoraphobia: Secondary | ICD-10-CM | POA: Diagnosis not present

## 2023-03-14 DIAGNOSIS — L089 Local infection of the skin and subcutaneous tissue, unspecified: Secondary | ICD-10-CM | POA: Diagnosis not present

## 2023-03-14 DIAGNOSIS — E109 Type 1 diabetes mellitus without complications: Secondary | ICD-10-CM | POA: Diagnosis not present

## 2023-03-14 DIAGNOSIS — F411 Generalized anxiety disorder: Secondary | ICD-10-CM | POA: Diagnosis not present

## 2023-03-16 DIAGNOSIS — G4733 Obstructive sleep apnea (adult) (pediatric): Secondary | ICD-10-CM | POA: Diagnosis not present

## 2023-03-20 DIAGNOSIS — H31091 Other chorioretinal scars, right eye: Secondary | ICD-10-CM | POA: Diagnosis not present

## 2023-03-20 DIAGNOSIS — H3582 Retinal ischemia: Secondary | ICD-10-CM | POA: Diagnosis not present

## 2023-03-20 DIAGNOSIS — H35033 Hypertensive retinopathy, bilateral: Secondary | ICD-10-CM | POA: Diagnosis not present

## 2023-03-20 DIAGNOSIS — E103513 Type 1 diabetes mellitus with proliferative diabetic retinopathy with macular edema, bilateral: Secondary | ICD-10-CM | POA: Diagnosis not present

## 2023-03-22 DIAGNOSIS — F411 Generalized anxiety disorder: Secondary | ICD-10-CM | POA: Diagnosis not present

## 2023-03-22 DIAGNOSIS — E103512 Type 1 diabetes mellitus with proliferative diabetic retinopathy with macular edema, left eye: Secondary | ICD-10-CM | POA: Diagnosis not present

## 2023-04-03 DIAGNOSIS — F411 Generalized anxiety disorder: Secondary | ICD-10-CM | POA: Diagnosis not present

## 2023-04-09 DIAGNOSIS — M65352 Trigger finger, left little finger: Secondary | ICD-10-CM | POA: Diagnosis not present

## 2023-04-10 DIAGNOSIS — E109 Type 1 diabetes mellitus without complications: Secondary | ICD-10-CM | POA: Diagnosis not present

## 2023-04-10 DIAGNOSIS — Z794 Long term (current) use of insulin: Secondary | ICD-10-CM | POA: Diagnosis not present

## 2023-04-10 DIAGNOSIS — E108 Type 1 diabetes mellitus with unspecified complications: Secondary | ICD-10-CM | POA: Diagnosis not present

## 2023-04-10 DIAGNOSIS — Z9641 Presence of insulin pump (external) (internal): Secondary | ICD-10-CM | POA: Diagnosis not present

## 2023-04-19 DIAGNOSIS — E103511 Type 1 diabetes mellitus with proliferative diabetic retinopathy with macular edema, right eye: Secondary | ICD-10-CM | POA: Diagnosis not present

## 2023-05-04 DIAGNOSIS — M65352 Trigger finger, left little finger: Secondary | ICD-10-CM | POA: Diagnosis not present

## 2023-05-04 DIAGNOSIS — E1065 Type 1 diabetes mellitus with hyperglycemia: Secondary | ICD-10-CM | POA: Diagnosis not present

## 2023-05-09 DIAGNOSIS — E103511 Type 1 diabetes mellitus with proliferative diabetic retinopathy with macular edema, right eye: Secondary | ICD-10-CM | POA: Diagnosis not present

## 2023-05-10 DIAGNOSIS — G4733 Obstructive sleep apnea (adult) (pediatric): Secondary | ICD-10-CM | POA: Diagnosis not present

## 2023-05-16 DIAGNOSIS — E103512 Type 1 diabetes mellitus with proliferative diabetic retinopathy with macular edema, left eye: Secondary | ICD-10-CM | POA: Diagnosis not present

## 2023-05-18 DIAGNOSIS — M65352 Trigger finger, left little finger: Secondary | ICD-10-CM | POA: Diagnosis not present

## 2023-05-18 DIAGNOSIS — G4733 Obstructive sleep apnea (adult) (pediatric): Secondary | ICD-10-CM | POA: Diagnosis not present

## 2023-05-28 DIAGNOSIS — G4733 Obstructive sleep apnea (adult) (pediatric): Secondary | ICD-10-CM | POA: Diagnosis not present

## 2023-05-30 DIAGNOSIS — M65321 Trigger finger, right index finger: Secondary | ICD-10-CM | POA: Diagnosis not present

## 2023-05-30 DIAGNOSIS — E103512 Type 1 diabetes mellitus with proliferative diabetic retinopathy with macular edema, left eye: Secondary | ICD-10-CM | POA: Diagnosis not present

## 2023-05-30 DIAGNOSIS — E1040 Type 1 diabetes mellitus with diabetic neuropathy, unspecified: Secondary | ICD-10-CM | POA: Diagnosis not present

## 2023-06-07 DIAGNOSIS — E103511 Type 1 diabetes mellitus with proliferative diabetic retinopathy with macular edema, right eye: Secondary | ICD-10-CM | POA: Diagnosis not present

## 2023-06-13 DIAGNOSIS — H40031 Anatomical narrow angle, right eye: Secondary | ICD-10-CM | POA: Diagnosis not present

## 2023-07-09 DIAGNOSIS — H40032 Anatomical narrow angle, left eye: Secondary | ICD-10-CM | POA: Diagnosis not present

## 2023-07-12 DIAGNOSIS — F410 Panic disorder [episodic paroxysmal anxiety] without agoraphobia: Secondary | ICD-10-CM | POA: Diagnosis not present

## 2023-07-12 DIAGNOSIS — F422 Mixed obsessional thoughts and acts: Secondary | ICD-10-CM | POA: Diagnosis not present

## 2023-07-12 DIAGNOSIS — F411 Generalized anxiety disorder: Secondary | ICD-10-CM | POA: Diagnosis not present

## 2023-07-18 DIAGNOSIS — E103512 Type 1 diabetes mellitus with proliferative diabetic retinopathy with macular edema, left eye: Secondary | ICD-10-CM | POA: Diagnosis not present

## 2023-07-24 DIAGNOSIS — G4733 Obstructive sleep apnea (adult) (pediatric): Secondary | ICD-10-CM | POA: Diagnosis not present

## 2023-08-07 DIAGNOSIS — E103511 Type 1 diabetes mellitus with proliferative diabetic retinopathy with macular edema, right eye: Secondary | ICD-10-CM | POA: Diagnosis not present

## 2023-08-16 DIAGNOSIS — H40033 Anatomical narrow angle, bilateral: Secondary | ICD-10-CM | POA: Diagnosis not present

## 2023-08-16 DIAGNOSIS — E103493 Type 1 diabetes mellitus with severe nonproliferative diabetic retinopathy without macular edema, bilateral: Secondary | ICD-10-CM | POA: Diagnosis not present

## 2023-08-17 DIAGNOSIS — E108 Type 1 diabetes mellitus with unspecified complications: Secondary | ICD-10-CM | POA: Diagnosis not present

## 2023-08-17 DIAGNOSIS — Z794 Long term (current) use of insulin: Secondary | ICD-10-CM | POA: Diagnosis not present

## 2023-08-17 DIAGNOSIS — E109 Type 1 diabetes mellitus without complications: Secondary | ICD-10-CM | POA: Diagnosis not present

## 2023-08-17 DIAGNOSIS — Z9641 Presence of insulin pump (external) (internal): Secondary | ICD-10-CM | POA: Diagnosis not present

## 2023-09-24 DIAGNOSIS — Z9641 Presence of insulin pump (external) (internal): Secondary | ICD-10-CM | POA: Diagnosis not present

## 2023-09-24 DIAGNOSIS — E109 Type 1 diabetes mellitus without complications: Secondary | ICD-10-CM | POA: Diagnosis not present

## 2023-09-24 DIAGNOSIS — E108 Type 1 diabetes mellitus with unspecified complications: Secondary | ICD-10-CM | POA: Diagnosis not present

## 2023-09-24 DIAGNOSIS — Z794 Long term (current) use of insulin: Secondary | ICD-10-CM | POA: Diagnosis not present

## 2023-09-26 DIAGNOSIS — I10 Essential (primary) hypertension: Secondary | ICD-10-CM | POA: Diagnosis not present

## 2023-09-26 DIAGNOSIS — E1040 Type 1 diabetes mellitus with diabetic neuropathy, unspecified: Secondary | ICD-10-CM | POA: Diagnosis not present

## 2023-10-08 DIAGNOSIS — E103512 Type 1 diabetes mellitus with proliferative diabetic retinopathy with macular edema, left eye: Secondary | ICD-10-CM | POA: Diagnosis not present

## 2023-10-16 DIAGNOSIS — E103511 Type 1 diabetes mellitus with proliferative diabetic retinopathy with macular edema, right eye: Secondary | ICD-10-CM | POA: Diagnosis not present

## 2023-10-16 DIAGNOSIS — G4733 Obstructive sleep apnea (adult) (pediatric): Secondary | ICD-10-CM | POA: Diagnosis not present

## 2023-11-20 DIAGNOSIS — L089 Local infection of the skin and subcutaneous tissue, unspecified: Secondary | ICD-10-CM | POA: Diagnosis not present

## 2023-11-23 DIAGNOSIS — F410 Panic disorder [episodic paroxysmal anxiety] without agoraphobia: Secondary | ICD-10-CM | POA: Diagnosis not present

## 2023-11-23 DIAGNOSIS — F900 Attention-deficit hyperactivity disorder, predominantly inattentive type: Secondary | ICD-10-CM | POA: Diagnosis not present

## 2023-11-23 DIAGNOSIS — F422 Mixed obsessional thoughts and acts: Secondary | ICD-10-CM | POA: Diagnosis not present

## 2023-11-27 DIAGNOSIS — E1040 Type 1 diabetes mellitus with diabetic neuropathy, unspecified: Secondary | ICD-10-CM | POA: Diagnosis not present

## 2023-11-27 DIAGNOSIS — M79674 Pain in right toe(s): Secondary | ICD-10-CM | POA: Diagnosis not present

## 2023-11-28 DIAGNOSIS — F411 Generalized anxiety disorder: Secondary | ICD-10-CM | POA: Diagnosis not present

## 2023-12-05 ENCOUNTER — Encounter: Admitting: Podiatry

## 2023-12-05 NOTE — Progress Notes (Signed)
Patient did not show for scheduled appointment today.

## 2023-12-19 DIAGNOSIS — F411 Generalized anxiety disorder: Secondary | ICD-10-CM | POA: Diagnosis not present

## 2023-12-24 DIAGNOSIS — F900 Attention-deficit hyperactivity disorder, predominantly inattentive type: Secondary | ICD-10-CM | POA: Diagnosis not present

## 2023-12-24 DIAGNOSIS — F410 Panic disorder [episodic paroxysmal anxiety] without agoraphobia: Secondary | ICD-10-CM | POA: Diagnosis not present

## 2023-12-24 DIAGNOSIS — F422 Mixed obsessional thoughts and acts: Secondary | ICD-10-CM | POA: Diagnosis not present

## 2024-01-03 ENCOUNTER — Ambulatory Visit: Admitting: Podiatry

## 2024-01-03 DIAGNOSIS — L02611 Cutaneous abscess of right foot: Secondary | ICD-10-CM

## 2024-01-03 DIAGNOSIS — L03031 Cellulitis of right toe: Secondary | ICD-10-CM

## 2024-01-03 MED ORDER — CEPHALEXIN 500 MG PO CAPS: 500.0000 mg | ORAL_CAPSULE | Freq: Three times a day (TID) | ORAL | 1 refills | Status: DC

## 2024-01-03 NOTE — Patient Instructions (Signed)

## 2024-01-03 NOTE — Progress Notes (Signed)
 Subjective:   Patient ID: Calvin Baxter, male   DOB: 38 y.o.   MRN: [id number]   HPI Patient presents stating that he tore his right fifth nail and he is a 35-year history of diabetes and also he has significant neuropathic symptomatology with burning shooting pain and does take Lyrica which only helps slightly.  Has not had fever has not had any other systemic issues   ROS      Objective:  Physical Exam  Neurovascular status indicates diminishment of sharp dull vibratory with pulses within normal limits.  Patient's right fifth digit has been traumatized with loss of the nailbed it is localized I did not note any break in skin there is redness surrounding the area no proximal edema erythema drainage noted and no tissue pathology proximal that I could tell.  Also has significant neuropathic pain after 35 years of having diabetes with periods of poor control      Assessment:  Localized infection of the right fifth digit secondary to trauma to the nailbed with no proximal indication of infection with relative profound neuropathy that is bothersome for him on a daily basis     Plan:  H&P reviewed condition and at this point I have advised on soaks and I placed him on cephalexin  3 times daily.  I also placed a surgical shoe on him and I gave him strict instructions if any redness any systemic indications of infection to go straight to the emergency room and he does run the risk of amputation.  Patient understands this and it should be self-limiting but will be seen back as needed.  I then discussed his neuropathic symptoms and the difficulty that they create for him and I do think long-term Qutenza would be an excellent option given his failure to respond to oral medication.  We will get him approved for this treatment path

## 2024-01-04 DIAGNOSIS — E103512 Type 1 diabetes mellitus with proliferative diabetic retinopathy with macular edema, left eye: Secondary | ICD-10-CM | POA: Diagnosis not present

## 2024-01-18 DIAGNOSIS — E103511 Type 1 diabetes mellitus with proliferative diabetic retinopathy with macular edema, right eye: Secondary | ICD-10-CM | POA: Diagnosis not present

## 2024-01-22 DIAGNOSIS — L821 Other seborrheic keratosis: Secondary | ICD-10-CM | POA: Diagnosis not present

## 2024-01-22 DIAGNOSIS — D225 Melanocytic nevi of trunk: Secondary | ICD-10-CM | POA: Diagnosis not present

## 2024-01-22 DIAGNOSIS — D485 Neoplasm of uncertain behavior of skin: Secondary | ICD-10-CM | POA: Diagnosis not present

## 2024-01-31 DIAGNOSIS — E1040 Type 1 diabetes mellitus with diabetic neuropathy, unspecified: Secondary | ICD-10-CM | POA: Diagnosis not present

## 2024-01-31 DIAGNOSIS — E785 Hyperlipidemia, unspecified: Secondary | ICD-10-CM | POA: Diagnosis not present

## 2024-02-04 DIAGNOSIS — Z9641 Presence of insulin pump (external) (internal): Secondary | ICD-10-CM | POA: Diagnosis not present

## 2024-02-04 DIAGNOSIS — R82998 Other abnormal findings in urine: Secondary | ICD-10-CM | POA: Diagnosis not present

## 2024-02-04 DIAGNOSIS — E108 Type 1 diabetes mellitus with unspecified complications: Secondary | ICD-10-CM | POA: Diagnosis not present

## 2024-02-04 DIAGNOSIS — E109 Type 1 diabetes mellitus without complications: Secondary | ICD-10-CM | POA: Diagnosis not present

## 2024-02-04 DIAGNOSIS — Z1331 Encounter for screening for depression: Secondary | ICD-10-CM | POA: Diagnosis not present

## 2024-02-04 DIAGNOSIS — Z0000 Encounter for general adult medical examination without abnormal findings: Secondary | ICD-10-CM | POA: Diagnosis not present

## 2024-02-04 DIAGNOSIS — Z794 Long term (current) use of insulin: Secondary | ICD-10-CM | POA: Diagnosis not present

## 2024-02-04 DIAGNOSIS — E1040 Type 1 diabetes mellitus with diabetic neuropathy, unspecified: Secondary | ICD-10-CM | POA: Diagnosis not present

## 2024-02-04 DIAGNOSIS — I10 Essential (primary) hypertension: Secondary | ICD-10-CM | POA: Diagnosis not present

## 2024-02-26 DIAGNOSIS — R200 Anesthesia of skin: Secondary | ICD-10-CM | POA: Diagnosis not present

## 2024-02-26 DIAGNOSIS — I10 Essential (primary) hypertension: Secondary | ICD-10-CM | POA: Diagnosis not present

## 2024-02-26 DIAGNOSIS — F5221 Male erectile disorder: Secondary | ICD-10-CM | POA: Diagnosis not present

## 2024-02-26 DIAGNOSIS — G629 Polyneuropathy, unspecified: Secondary | ICD-10-CM | POA: Diagnosis not present

## 2024-02-29 ENCOUNTER — Ambulatory Visit (INDEPENDENT_AMBULATORY_CARE_PROVIDER_SITE_OTHER): Admitting: Podiatry

## 2024-02-29 DIAGNOSIS — E1142 Type 2 diabetes mellitus with diabetic polyneuropathy: Secondary | ICD-10-CM

## 2024-02-29 NOTE — Progress Notes (Signed)
 Chief Complaint  Patient presents with   Follow up for celluitis    FU for cellulitis. Right 5th toe, still puffy and red. No open area noted. No pain, does have neuropathy.  He has been soaking, and he did complete the ABX Dr. Magdalen put him on.  A1c was 7.5 in Aug No anti coag.    HPI: 38 y.o. male presents today, unsure why he is here today.  He saw Dr. Magdalen 2 months ago for cellulitis of the right fifth toe.  He had taken all of the antibiotics at that time and wore the surgical shoe.  He performed the soaks as instructed.  He denies any pain but he also has neuropathy in his feet.  His appointment was at 7:45 this morning but he did not check it until 7:54 AM and stated he needed to leave in 10 to 12 minutes for an appointment in [place], then asked the medical assistant how long it would take for the doctor to come into the room.  States his A1c is around 7.  He takes gabapentin  and Lyrica for neuropathy.  He does have an appointment with some type of specialist to have treatments performed for his neuropathy to see if this will help.  He is also requesting orthotics for his shoes.  This request was at the end of the appointment on his way out the door.  Past Medical History:  Diagnosis Date   Alcoholism (HCC)    Anxiety, generalized    on lexapro   Childhood asthma    Dizziness    GERD (gastroesophageal reflux disease)    Neuropathy    diagnosed by Dr Sikora per patient   Type I diabetes mellitus [facility])    Past Surgical History:  Procedure Laterality Date   LAPAROSCOPIC APPENDECTOMY N/A 12/26/2017   Procedure: APPENDECTOMY LAPAROSCOPIC;  Surgeon: Teresa Lonni HERO, MD;  Location: [facility] OR;  Service: General;  Laterality: N/A;   NO PAST SURGERIES     None     No Known Allergies   Physical Exam: Palpable pedal pulses noted.  No edema is appreciated in the ankles or feet.  There is mild generalized edema to the bilateral lower legs.  Interspaces are clear of maceration and  debris and ulceration.  Right fifth toe has minimal erythema but no edema or calor is noted.  No pain on palpation is noted.  There did not seem to be a prominent seam inside of his right shoe.  The shoes are not supportive or cushioned very well.  Diminished protective sensation with Semmes Weinstein monofilament bilateral.  Assessment/Plan of Care: 1. Type 2 diabetes mellitus with peripheral neuropathy (HCC)     FOR HOME USE ONLY DME CUSTOM ORTHOTICS  Discussed findings with the patient today.  Have no further options regarding his neuropathy.  If he continues to have issues then this other treatment he is getting elsewhere is not effective, I would refer to neurology for evaluation and management (or pain management).  I will get him set up with an appointment with our pedorthist in the near future for evaluation and obtaining foot moldings for custom orthotics.  He states he has gotten them from our office in the past and were covered by his insurance.  The patient did start to leave the exam room while I was still talking to him, to get to his other appointment.  Will follow-up as needed.   Awanda CHARM [place], DPM, FACFAS [facility]  [address] [place], [place] [address]                Office [phone]  Fax [phone]

## 2024-03-11 DIAGNOSIS — G4733 Obstructive sleep apnea (adult) (pediatric): Secondary | ICD-10-CM | POA: Diagnosis not present

## 2024-03-26 ENCOUNTER — Encounter: Payer: Self-pay | Admitting: Allergy and Immunology

## 2024-03-26 ENCOUNTER — Ambulatory Visit: Admitting: Allergy and Immunology

## 2024-03-26 ENCOUNTER — Other Ambulatory Visit

## 2024-03-26 VITALS — BP 130/80 | HR 96 | Resp 14 | Ht 66.0 in | Wt 212.0 lb

## 2024-03-26 DIAGNOSIS — R0609 Other forms of dyspnea: Secondary | ICD-10-CM | POA: Diagnosis not present

## 2024-03-26 DIAGNOSIS — J4530 Mild persistent asthma, uncomplicated: Secondary | ICD-10-CM | POA: Diagnosis not present

## 2024-03-26 DIAGNOSIS — Z113 Encounter for screening for infections with a predominantly sexual mode of transmission: Secondary | ICD-10-CM | POA: Diagnosis not present

## 2024-03-26 DIAGNOSIS — M7989 Other specified soft tissue disorders: Secondary | ICD-10-CM

## 2024-03-26 DIAGNOSIS — E1040 Type 1 diabetes mellitus with diabetic neuropathy, unspecified: Secondary | ICD-10-CM | POA: Diagnosis not present

## 2024-03-26 MED ORDER — AIRSUPRA 90-80 MCG/ACT IN AERO: 2.0000 | INHALATION_SPRAY | RESPIRATORY_TRACT | 3 refills | Status: AC | PRN

## 2024-03-26 MED ORDER — QVAR REDIHALER 40 MCG/ACT IN AERB: 2.0000 | INHALATION_SPRAY | Freq: Two times a day (BID) | RESPIRATORY_TRACT | 5 refills | Status: AC

## 2024-03-26 MED ORDER — ALBUTEROL SULFATE (2.5 MG/3ML) 0.083% IN NEBU
2.5000 mg | INHALATION_SOLUTION | Freq: Once | RESPIRATORY_TRACT | Status: AC
  Administered 2024-03-26: 2.5 mg via RESPIRATORY_TRACT

## 2024-03-26 NOTE — Patient Instructions (Addendum)
  1. Return to clinic for skin testing (no antihistamines)  2. Blood - BNP, CBC w/d  3. Obtain a chest x-ray  4. Obtain a ECHO  5. Treat and prevent inflammation of airway:   A. Ovar 40 - 2 inhalations 2 times per day (empty lungs)  6. If needed:   A. Airsupra - 2 inhalations every 4-6 hours  7. Influenza = Tamiflu. Covid = paxlovid

## 2024-03-26 NOTE — Progress Notes (Unsigned)
 [place] - [place] - [place] - [place] - [place]   Dear Dr. Shepard,  Thank you for referring Calvin Baxter to the [facility] [place]  on 03/26/2024.   Below is a summation of this patient's evaluation and recommendations.  Thank you for your referral. I will keep you informed about this patient's response to treatment.   If you have any questions please do not hesitate to contact me.   Sincerely,  Camellia DOROTHA Denis, MD Allergy / Immunology [facility] [place]    ______________________________________________________________________    NEW PATIENT NOTE  Referring Provider: Shepard Ade, MD Primary Provider: Shepard Ade, MD Date of office visit: 03/26/2024    Subjective:   Chief Complaint:  Calvin Baxter (DOB: [birth year]) is a 38 y.o. male who presents to the clinic on 03/26/2024 with a chief complaint of Shortness of Breath (Started 5-6 months ago ) .     HPI: Calvin Baxter presents to this clinic in evaluation of breathing problems.  For the past 4 to 5 months he has been having shortness of breath.  Specifically, he has been having lots of dyspnea on exertion.  He has 3 flights to go up to get to work and by that time he is at the top flight he is completely breathless.  He has been doing this now for the past 5 months and he thought that he would be much better during this timeframe but he is never improving.  He does not really hear any wheezing and he does not have that much coughing.  He does have a history of cat induced asthma when he was around the age of 87.  He has had a little bit more cat exposure recently as there are now outdoor cats around his house and he does occasionally pick them up.  He also notes that he has had some leg swelling over the course of the past 5 months.  He has very little nasal symptoms.  Past Medical History:  Diagnosis Date   Alcoholism (HCC)     Anxiety, generalized    on lexapro   Childhood asthma    Dizziness    GERD (gastroesophageal reflux disease)    Neuropathy    diagnosed by Dr Joya per patient   Type I diabetes mellitus (HCC)     Past Surgical History:  Procedure Laterality Date   LAPAROSCOPIC APPENDECTOMY N/A 12/26/2017   Procedure: APPENDECTOMY LAPAROSCOPIC;  Surgeon: Teresa Lonni HERO, MD;  Location: [facility] OR;  Service: General;  Laterality: N/A;   NO PAST SURGERIES     None      Allergies as of 03/26/2024   No Known Allergies      Medication List    Adderall XR 10 MG 24 hr capsule Generic drug: amphetamine-dextroamphetamine 1 capsule in the morning Orally Once a day   Baqsimi One Pack 3 MG/DOSE Powd Generic drug: Glucagon USE AS DIRECTED AS NEEDED NASALLY FOR 30 DAYS; Duration: 30   clonazePAM  0.5 MG tablet Commonly known as: KLONOPIN  Take 0.5 mg by mouth 3 (three) times daily as needed.   dextroamphetamine 15 MG 24 hr capsule Commonly known as: DEXEDRINE SPANSULE Take by mouth.   fluocinonide 0.05 % external solution Commonly known as: LIDEX Apply topically 2 (two) times daily.   gabapentin  800 MG tablet Commonly known as: NEURONTIN  gabapentin  800 mg tablet  Take 2 tablets every day by oral route.   HumaLOG 100 UNIT/ML injection Generic drug:  insulin  lispro USE AS DIRECTED IN INSULIN  PUMP - MAX OF 100 UNITS PER DAY; Duration: 90   hydrocortisone 2.5 % cream 1 application on rash Externally Once a day; Duration: 30 days   insulin  aspart protamine- aspart (70-30) 100 UNIT/ML injection Commonly known as: NOVOLOG MIX 70/30 Inject into the skin.   ketoconazole 2 % shampoo Commonly known as: NIZORAL use 3 times a week   Lagevrio 200 MG Caps capsule Generic drug: molnupiravir EUA SMARTSIG:4 Capsule(s) By Mouth Every 12 Hours   meclizine  25 MG tablet Commonly known as: ANTIVERT  1 tablet as needed Orally TID, PRN; Duration: 30 day(s)   pregabalin 150 MG capsule Commonly known as:  LYRICA Take 150 mg by mouth 2 (two) times daily.   rosuvastatin 10 MG tablet Commonly known as: CRESTOR Take 10 mg by mouth daily.   sildenafil 20 MG tablet Commonly known as: REVATIO take 1 to 5 tablets by mouth 1 hour prior to sexual activity as needed Oral; Duration: 30 days   traZODone 50 MG tablet Commonly known as: DESYREL Take 50-150 mg by mouth at bedtime as needed.   valACYclovir  1000 MG tablet Commonly known as: VALTREX  TAKE 1 TABLET BY MOUTH TWICE DAILY DURING  COLD  SORES; Duration: 15   venlafaxine XR 75 MG 24 hr capsule Commonly known as: EFFEXOR-XR Take 225 mg by mouth every morning.    Review of systems negative except as noted in HPI / PMHx or noted below:  Review of Systems  Constitutional: Negative.   HENT: Negative.    Eyes: Negative.   Respiratory: Negative.    Cardiovascular: Negative.   Gastrointestinal: Negative.   Genitourinary: Negative.   Musculoskeletal: Negative.   Skin: Negative.   Neurological: Negative.   Endo/Heme/Allergies: Negative.   Psychiatric/Behavioral: Negative.      Family History  Problem Relation Age of Onset   Breast cancer Maternal Grandmother    Colon cancer Neg Hx    Liver cancer Neg Hx    Rectal cancer Neg Hx    Stomach cancer Neg Hx    Esophageal cancer Neg Hx    Asthma Neg Hx    Allergic rhinitis Neg Hx    Eczema Neg Hx    Immunodeficiency Neg Hx    Atopy Neg Hx    Angioedema Neg Hx    Urticaria Neg Hx     Social History   Socioeconomic History   Marital status: Married    Spouse name: Not on file   Number of children: 0   Years of education: Not on file   Highest education level: Not on file  Occupational History   Occupation: professional caddy  Tobacco Use   Smoking status: Former    Current packs/day: 0.00    Average packs/day: 1 pack/day for 2.0 years (2.0 ttl pk-yrs)    Types: Cigarettes    Start date: 2006    Quit date: 2008    Years since quitting: 17.7   Smokeless tobacco: Former     Types: Chew    Quit date: 07/28/2015  Vaping Use   Vaping status: Never Used  Substance and Sexual Activity   Alcohol use: Yes    Comment: 3 beers a night about 3xs per week   Drug use: Never   Sexual activity: Yes  Other Topics Concern   Not on file  Social History Narrative   Not on file   Social Drivers of Health   Financial Resource Strain: Not on file  Food Insecurity: Not  on file  Transportation Needs: Not on file  Physical Activity: Not on file  Stress: Not on file  Social Connections: Unknown (07/21/2022)   Received from [facility]   Social Network    Social Network: Not on file  Intimate Partner Violence: Unknown (07/21/2022)   Received from [facility]   HITS    Physically Hurt: Not on file    Insult or Talk Down To: Not on file    Threaten Physical Harm: Not on file    Scream or Curse: Not on file    Environmental and Social history  Lives in a house with a dry environment, no animals located inside the household, carpet in the bedroom, no plastic on the bed, no plastic in the pillow, and no smoking ongoing with inside the household.  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  Objective:   Vitals:   03/26/24 1418  BP: 130/80  Pulse: 96  Resp: 14  SpO2: 95%   Height: 5' 6 (167.6 cm) Weight: 212 lb (96.2 kg)  Physical Exam Constitutional:      Appearance: He is not diaphoretic.  HENT:     Head: Normocephalic.     Right Ear: Tympanic membrane, ear canal and external ear normal.     Left Ear: Tympanic membrane, ear canal and external ear normal.     Nose: Nose normal. No mucosal edema or rhinorrhea.     Mouth/Throat:     Pharynx: Uvula midline. No oropharyngeal exudate.  Eyes:     Conjunctiva/sclera: Conjunctivae normal.  Neck:     Thyroid: No thyromegaly.     Trachea: Trachea normal. No tracheal tenderness or tracheal deviation.  Cardiovascular:     Rate and Rhythm: Normal rate and regular rhythm.     Heart sounds: Normal  heart sounds, S1 normal and S2 normal. No murmur heard. Pulmonary:     Effort: No respiratory distress.     Breath sounds: Normal breath sounds. No stridor. No wheezing or rales.  Musculoskeletal:     Right lower leg: Edema present.     Left lower leg: Edema present.  Lymphadenopathy:     Head:     Right side of head: No tonsillar adenopathy.     Left side of head: No tonsillar adenopathy.     Cervical: No cervical adenopathy.  Skin:    Findings: No erythema or rash.     Nails: There is no clubbing.  Neurological:     Mental Status: He is alert.     Diagnostics: Allergy skin tests were not performed.   Spirometry was performed and demonstrated an FEV1 of 2.77 @ 74 % of predicted. FEV1/FVC = 0.83.  Following administration nebulized albuterol his FEV1 did not improve significantly  Assessment and Plan:    1. Not well controlled mild persistent asthma   2. DOE (dyspnea on exertion)   3. Leg swelling    1. Return to clinic for skin testing (no antihistamines)  2. Blood - BNP, CBC w/d  3. Obtain a chest x-ray  4. Obtain a ECHO  5. Treat and prevent inflammation of airway:   A. Ovar 40 - 2 inhalations 2 times per day (empty lungs)  6. If needed:   A. Airsupra - 2 inhalations every 4-6 hours  7. Influenza = Tamiflu. Covid = paxlovid  Rustin very well could have a component of inflammation affecting his airway for which he will use a inhaled steroid and we will have  him undergo further evaluation for other etiologic factors above and beyond inflammation of his airway giving rise to his rather significant dyspnea on exertion and also some lower extremity edema.  Once we have the results of his diagnostic studies available for review we will contact him with these results.  He will be returning to this clinic for skin testing to define atopic disease contributing to his respiratory tract symptoms.  I will see him back in this clinic for skin testing.  Camellia DOROTHA Denis,  MD Allergy / Immunology [facility] [place] 

## 2024-03-27 ENCOUNTER — Encounter: Payer: Self-pay | Admitting: Allergy and Immunology

## 2024-03-28 ENCOUNTER — Telehealth: Payer: Self-pay | Admitting: *Deleted

## 2024-03-28 LAB — BRAIN NATRIURETIC PEPTIDE: BNP: 10.3 pg/mL (ref 0.0–100.0)

## 2024-03-28 LAB — CBC WITH DIFFERENTIAL/PLATELET
Basophils Absolute: 0 x10E3/uL (ref 0.0–0.2)
Basos: 1 %
EOS (ABSOLUTE): 0.4 x10E3/uL (ref 0.0–0.4)
Eos: 5 %
Hematocrit: 45.5 % (ref 37.5–51.0)
Hemoglobin: 15.4 g/dL (ref 13.0–17.7)
Immature Grans (Abs): 0 x10E3/uL (ref 0.0–0.1)
Immature Granulocytes: 0 %
Lymphocytes Absolute: 2.1 x10E3/uL (ref 0.7–3.1)
Lymphs: 25 %
MCH: 34.8 pg — ABNORMAL HIGH (ref 26.6–33.0)
MCHC: 33.8 g/dL (ref 31.5–35.7)
MCV: 103 fL — ABNORMAL HIGH (ref 79–97)
Monocytes Absolute: 0.5 x10E3/uL (ref 0.1–0.9)
Monocytes: 6 %
Neutrophils Absolute: 5.1 x10E3/uL (ref 1.4–7.0)
Neutrophils: 62 %
Platelets: 233 x10E3/uL (ref 150–450)
RBC: 4.43 x10E6/uL (ref 4.14–5.80)
RDW: 13 % (ref 11.6–15.4)
WBC: 8.1 x10E3/uL (ref 3.4–10.8)

## 2024-03-28 NOTE — Telephone Encounter (Signed)
 Spoke with insurance and the Echo 2D with doppler does not require a pre certification. Reference # [id number]. He has been scheduled at [facility] for Monday October 13th at 10:30. I tried calling pt to inform but no answer and unable to leave voicemail, mailbox was full.

## 2024-03-31 ENCOUNTER — Ambulatory Visit: Admitting: Allergy and Immunology

## 2024-03-31 ENCOUNTER — Ambulatory Visit: Payer: Self-pay | Admitting: Allergy and Immunology

## 2024-03-31 DIAGNOSIS — E103512 Type 1 diabetes mellitus with proliferative diabetic retinopathy with macular edema, left eye: Secondary | ICD-10-CM | POA: Diagnosis not present

## 2024-03-31 NOTE — Telephone Encounter (Signed)
 Pt informed. However he plans to reschedule this for a different day, phone number provided to call to reschedule.

## 2024-04-03 ENCOUNTER — Ambulatory Visit (INDEPENDENT_AMBULATORY_CARE_PROVIDER_SITE_OTHER): Admitting: Allergy and Immunology

## 2024-04-03 DIAGNOSIS — J4530 Mild persistent asthma, uncomplicated: Secondary | ICD-10-CM | POA: Diagnosis not present

## 2024-04-03 DIAGNOSIS — D7589 Other specified diseases of blood and blood-forming organs: Secondary | ICD-10-CM

## 2024-04-04 LAB — B12 AND FOLATE PANEL
Folate: 10.6 ng/mL (ref >3.0)
Vitamin B-12: 405 pg/mL (ref 232–1245)

## 2024-04-07 ENCOUNTER — Ambulatory Visit: Payer: Self-pay | Admitting: Allergy and Immunology

## 2024-04-07 ENCOUNTER — Encounter: Payer: Self-pay | Admitting: Allergy and Immunology

## 2024-04-07 NOTE — Progress Notes (Signed)
 Calvin Baxter returns to this clinic for skin testing.  Allergy skin testing identified hypersensitivity against dust mite, cat, dog, trees, grasses, weeds.  Allergen avoidance measures provided.  On previous skin testing he had macrocytosis.  We will obtain a B12 and folate level.

## 2024-04-09 ENCOUNTER — Ambulatory Visit (INDEPENDENT_AMBULATORY_CARE_PROVIDER_SITE_OTHER)

## 2024-04-09 DIAGNOSIS — M21962 Unspecified acquired deformity of left lower leg: Secondary | ICD-10-CM | POA: Diagnosis not present

## 2024-04-09 DIAGNOSIS — M21961 Unspecified acquired deformity of right lower leg: Secondary | ICD-10-CM

## 2024-04-09 NOTE — Progress Notes (Signed)
 Orthotics   Patient was present and evaluated for Custom molded foot orthotics. Patient will benefit from CFO's to provide total contact to BIL MLA's helping to balance and distribute body weight more evenly across BIL feet helping to reduce plantar pressure and pain. Orthotic will also encourage FF / RF alignment  Patient was scanned today and will return for fitting upon receipt  Ded and Oop met for year  Lolita Schultze Cped, CFo, CFm

## 2024-04-24 ENCOUNTER — Ambulatory Visit (INDEPENDENT_AMBULATORY_CARE_PROVIDER_SITE_OTHER)
Admission: RE | Admit: 2024-04-24 | Discharge: 2024-04-24 | Disposition: A | Discharge status: Home / Self Care | Source: Ambulatory Visit | Attending: Allergy and Immunology | Admitting: Allergy and Immunology

## 2024-04-24 DIAGNOSIS — R0602 Shortness of breath: Secondary | ICD-10-CM | POA: Diagnosis not present

## 2024-05-01 ENCOUNTER — Other Ambulatory Visit

## 2024-05-05 ENCOUNTER — Ambulatory Visit: Admitting: Allergy and Immunology

## 2024-05-05 DIAGNOSIS — J309 Allergic rhinitis, unspecified: Secondary | ICD-10-CM

## 2024-05-07 DIAGNOSIS — Z9641 Presence of insulin pump (external) (internal): Secondary | ICD-10-CM | POA: Diagnosis not present

## 2024-05-07 DIAGNOSIS — Z794 Long term (current) use of insulin: Secondary | ICD-10-CM | POA: Diagnosis not present

## 2024-05-07 DIAGNOSIS — E108 Type 1 diabetes mellitus with unspecified complications: Secondary | ICD-10-CM | POA: Diagnosis not present

## 2024-05-07 DIAGNOSIS — E109 Type 1 diabetes mellitus without complications: Secondary | ICD-10-CM | POA: Diagnosis not present

## 2024-05-15 ENCOUNTER — Telehealth: Payer: Self-pay | Admitting: Podiatry

## 2024-05-15 NOTE — Telephone Encounter (Signed)
 Patient states he was given a date to pick up his orthotics and was under the impression that the date was scheduled as an appointment. Please verify if the orthotics are ready for pickup. An appointment has been made for 06/05/24 at 1:00 PM, and the patient has been added to the waitlist.

## 2024-05-15 NOTE — Telephone Encounter (Signed)
 Per secure chat w/ Prentice: AM they were shipped to us  back in 10/29  so they should be here soon

## 2024-06-05 ENCOUNTER — Other Ambulatory Visit: Payer: Self-pay

## 2024-06-05 ENCOUNTER — Ambulatory Visit (INDEPENDENT_AMBULATORY_CARE_PROVIDER_SITE_OTHER): Payer: Self-pay

## 2024-06-05 DIAGNOSIS — M21962 Unspecified acquired deformity of left lower leg: Secondary | ICD-10-CM

## 2024-06-05 DIAGNOSIS — M21961 Unspecified acquired deformity of right lower leg: Secondary | ICD-10-CM

## 2024-06-05 DIAGNOSIS — E1142 Type 2 diabetes mellitus with diabetic polyneuropathy: Secondary | ICD-10-CM

## 2024-06-05 NOTE — Progress Notes (Signed)
